# Patient Record
Sex: Male | Born: 1965 | Race: Black or African American | Hispanic: No | Marital: Married | Smoking: Never smoker
Health system: Southern US, Community
[De-identification: ages and names within clinical notes are randomized; demographics above are authoritative.]

## PROBLEM LIST (undated history)

## (undated) ENCOUNTER — Ambulatory Visit: Disposition: A | Discharge status: Home / Self Care | Payer: Federal, State, Local not specified - PPO

## (undated) DIAGNOSIS — I10 Essential (primary) hypertension: Secondary | ICD-10-CM

## (undated) DIAGNOSIS — K219 Gastro-esophageal reflux disease without esophagitis: Secondary | ICD-10-CM

## (undated) DIAGNOSIS — K429 Umbilical hernia without obstruction or gangrene: Secondary | ICD-10-CM

## (undated) DIAGNOSIS — K4020 Bilateral inguinal hernia, without obstruction or gangrene, not specified as recurrent: Secondary | ICD-10-CM

## (undated) HISTORY — DX: Bilateral inguinal hernia, without obstruction or gangrene, not specified as recurrent: K40.20

## (undated) HISTORY — DX: Essential (primary) hypertension: I10

## (undated) HISTORY — DX: Umbilical hernia without obstruction or gangrene: K42.9

## (undated) HISTORY — DX: Gastro-esophageal reflux disease without esophagitis: K21.9

## (undated) HISTORY — PX: INGUINAL HERNIA REPAIR: SUR1180

---

## 2005-03-01 ENCOUNTER — Emergency Department (HOSPITAL_COMMUNITY): Admission: EM | Admit: 2005-03-01 | Discharge: 2005-03-02 | Payer: Self-pay | Admitting: Emergency Medicine

## 2009-12-06 ENCOUNTER — Ambulatory Visit: Payer: Self-pay | Admitting: Internal Medicine

## 2009-12-06 DIAGNOSIS — K219 Gastro-esophageal reflux disease without esophagitis: Secondary | ICD-10-CM | POA: Insufficient documentation

## 2009-12-06 DIAGNOSIS — I10 Essential (primary) hypertension: Secondary | ICD-10-CM | POA: Insufficient documentation

## 2010-02-05 ENCOUNTER — Ambulatory Visit: Payer: Self-pay | Admitting: Internal Medicine

## 2010-02-09 ENCOUNTER — Encounter: Payer: Self-pay | Admitting: Internal Medicine

## 2010-02-09 ENCOUNTER — Ambulatory Visit: Payer: Self-pay | Admitting: Internal Medicine

## 2010-02-09 DIAGNOSIS — M545 Low back pain, unspecified: Secondary | ICD-10-CM | POA: Insufficient documentation

## 2010-02-09 DIAGNOSIS — M79605 Pain in left leg: Secondary | ICD-10-CM | POA: Insufficient documentation

## 2010-02-09 DIAGNOSIS — M5412 Radiculopathy, cervical region: Secondary | ICD-10-CM | POA: Insufficient documentation

## 2010-02-26 ENCOUNTER — Ambulatory Visit: Payer: Self-pay | Admitting: Internal Medicine

## 2010-02-26 DIAGNOSIS — R209 Unspecified disturbances of skin sensation: Secondary | ICD-10-CM | POA: Insufficient documentation

## 2010-03-09 ENCOUNTER — Encounter (INDEPENDENT_AMBULATORY_CARE_PROVIDER_SITE_OTHER): Payer: Self-pay | Admitting: *Deleted

## 2010-03-15 ENCOUNTER — Encounter: Payer: Self-pay | Admitting: Internal Medicine

## 2010-07-02 ENCOUNTER — Ambulatory Visit: Payer: Self-pay | Admitting: Internal Medicine

## 2010-07-02 DIAGNOSIS — G4730 Sleep apnea, unspecified: Secondary | ICD-10-CM | POA: Insufficient documentation

## 2010-07-11 ENCOUNTER — Ambulatory Visit: Payer: Self-pay | Admitting: Pulmonary Disease

## 2010-07-17 ENCOUNTER — Encounter
Admission: RE | Admit: 2010-07-17 | Discharge: 2010-08-13 | Payer: Self-pay | Source: Home / Self Care | Attending: Internal Medicine | Admitting: Internal Medicine

## 2010-07-18 ENCOUNTER — Encounter: Payer: Self-pay | Admitting: Internal Medicine

## 2010-08-13 ENCOUNTER — Encounter: Payer: Self-pay | Admitting: Internal Medicine

## 2010-08-16 NOTE — Letter (Signed)
Summary: Results Follow-up Letter  [facility]  [address] [place], [place] [address]   Phone: [phone]  Fax: [phone]    02/09/2010  [address] [place], [place]  [address]  Dear Andre Stein,   The following are the results of your recent test(s):  Test     Result     Low back xrays   normal Neck Xrays     severe arthritis   _________________________________________________________  Please call for an appointment soon, I think you need to have an MRI of your neck done _________________________________________________________ _________________________________________________________ _________________________________________________________  Sincerely,  Sanda Linger MD  [facility]

## 2010-08-16 NOTE — Assessment & Plan Note (Signed)
Summary: f/u appt/#/cd   Vital Signs:  Patient profile:   45 year old male Height:      71 inches Weight:      223 pounds BMI:     31.21 O2 Sat:      98 % on Room air Temp:     97.8 degrees F oral Pulse rate:   64 / minute Pulse rhythm:   regular Resp:     16 per minute BP sitting:   112 / 76  (left arm) Cuff size:   large  Vitals Entered By: Rock Nephew CMA (July 02, 2010 11:37 AM)  Nutrition Counseling: Patient's BMI is greater than 25 and therefore counseled on weight management options.  O2 Flow:  Room air CC: follow-up visit//med refill Is Patient Diabetic? No Pain Assessment Patient in pain? no       Does patient need assistance? Functional Status Self care Ambulation Normal   Primary Care Provider:  Etta Grandchild MD  CC:  follow-up visit//med refill.  History of Present Illness: He returns for f/up and to discuss his recent neuro. testing. He has an acute RUE radiculopathy on the EMG but he is happy to report today that all of his symptoms have resolved (no N/W/T and no neck or back pain). He wants to try PT.  Also, his wife tells me that his snoring is "really bad" and she has witnessed apnea.  Dyspepsia History:      He has no alarm features of dyspepsia including no history of melena, hematochezia, dysphagia, persistent vomiting, or involuntary weight loss > 5%.  There is a prior history of GERD.  The patient does not have a prior history of documented ulcer disease.  The dominant symptom is heartburn or acid reflux.  An H-2 blocker medication is currently being taken.  He notes that the symptoms have improved with the H-2 blocker therapy.  Symptoms have not persisted after 4 weeks of H-2 blocker treatment.     Preventive Screening-Counseling & Management  Alcohol-Tobacco     Alcohol drinks/day: 0     Alcohol Counseling: not indicated; patient does not drink     Smoking Status: never     Tobacco Counseling: not indicated; no tobacco  use  Current Medications (verified): 1)  Nexium 40 Mg Cpdr (Esomeprazole Magnesium) .... Once Daily 2)  Metoprolol Succinate 25 Mg Xr24h-Tab (Metoprolol Succinate) .... Once Daily 3)  Naproxen 500 Mg Tabs (Naproxen) .... One By Mouth Two Times A Day For Pain  Allergies (verified): No Known Drug Allergies  Past History:  Past Medical History: Last updated: 12/06/2009 GERD Hypertension  Past Surgical History: Last updated: 12/06/2009 Denies surgical history  Family History: Last updated: 12/06/2009 Family History of Arthritis Family History Diabetes 1st degree relative Family History Hypertension Family History of Stroke M 1st degree relative <50  Social History: Last updated: 02/09/2010 Occupation: Theatre manager Married Never Smoked Alcohol use-no Drug use-no Regular exercise-yes  Risk Factors: Alcohol Use: 0 (07/02/2010) Exercise: yes (12/06/2009)  Risk Factors: Smoking Status: never (07/02/2010)  Family History: Reviewed history from 12/06/2009 and no changes required. Family History of Arthritis Family History Diabetes 1st degree relative Family History Hypertension Family History of Stroke M 1st degree relative <50  Social History: Reviewed history from 02/09/2010 and no changes required. Occupation: Theatre manager Married Never Smoked Alcohol use-no Drug use-no Regular exercise-yes  Review of Systems       The patient complains of weight gain.  The patient denies anorexia,  fever, weight loss, chest pain, syncope, dyspnea on exertion, peripheral edema, prolonged cough, headaches, hemoptysis, abdominal pain, melena, hematochezia, severe indigestion/heartburn, hematuria, muscle weakness, suspicious skin lesions, difficulty walking, abnormal bleeding, and enlarged lymph nodes.   Resp:  Complains of excessive snoring, hypersomnolence, and morning headaches; denies chest discomfort, chest pain with inspiration, cough,  coughing up blood, pleuritic, shortness of breath, sputum productive, and wheezing.  Physical Exam  General:  alert, well-developed, well-nourished, well-hydrated, appropriate dress, normal appearance, healthy-appearing, cooperative to examination, and overweight-appearing.   Head:  normocephalic, atraumatic, no abnormalities observed, and no abnormalities palpated.   Mouth:  Oral mucosa and oropharynx without lesions or exudates.  Teeth in good repair. Neck:  supple, full ROM, no masses, no thyromegaly, no JVD, normal carotid upstroke, no carotid bruits, no cervical lymphadenopathy, and no neck tenderness.   Lungs:  normal respiratory effort, no intercostal retractions, no accessory muscle use, normal breath sounds, no dullness, no fremitus, no crackles, and no wheezes.   Heart:  normal rate, regular rhythm, no murmur, no gallop, no rub, and no JVD.   Abdomen:  soft, non-tender, normal bowel sounds, no distention, no masses, no guarding, no rigidity, no rebound tenderness, no abdominal hernia, no inguinal hernia, no hepatomegaly, and no splenomegaly.   Msk:  normal ROM, no joint tenderness, no joint swelling, no joint warmth, no redness over joints, no joint deformities, no joint instability, no crepitation, and no muscle atrophy.   Pulses:  R and L carotid,radial,femoral,dorsalis pedis and posterior tibial pulses are full and equal bilaterally Extremities:  No clubbing, cyanosis, edema, or deformity noted with normal full range of motion of all joints.   Neurologic:  No cranial nerve deficits noted. Station and gait are normal. Plantar reflexes are down-going bilaterally. DTRs are symmetrical throughout. Sensory, motor and coordinative functions appear intact. Skin:  turgor normal, color normal, no rashes, no suspicious lesions, no ecchymoses, no petechiae, no purpura, no ulcerations, and no edema.   Cervical Nodes:  No lymphadenopathy noted Psych:  Cognition and judgment appear intact. Alert and  cooperative with normal attention span and concentration. No apparent delusions, illusions, hallucinations   Impression & Recommendations:  Problem # 1:  BACK PAIN, LUMBAR (ICD-724.2) Assessment Improved  His updated medication list for this problem includes:    Naproxen 500 Mg Tabs (Naproxen) ..... One by mouth two times a day for pain  Orders: Physical Therapy Referral (PT)  Problem # 2:  CERVICAL RADICULOPATHY (ICD-723.4) Assessment: Improved  Orders: Physical Therapy Referral (PT)  Problem # 3:  SLEEP APNEA (ICD-780.57) Assessment: New  Orders: Sleep Disorder Referral (Sleep Disorder)  Problem # 4:  HYPERTENSION (ICD-401.9) Assessment: Improved  His updated medication list for this problem includes:    Metoprolol Succinate 25 Mg Xr24h-tab (Metoprolol succinate) ..... Once daily  BP today: 112/76 Prior BP: 112/80 (02/26/2010)  Prior 10 Yr Risk Heart Disease: Not enough information (12/06/2009)  Problem # 5:  GERD (ICD-530.81) Assessment: Improved  His updated medication list for this problem includes:    Nexium 40 Mg Cpdr (Esomeprazole magnesium) ..... Once daily  Complete Medication List: 1)  Nexium 40 Mg Cpdr (Esomeprazole magnesium) .... Once daily 2)  Metoprolol Succinate 25 Mg Xr24h-tab (Metoprolol succinate) .... Once daily 3)  Naproxen 500 Mg Tabs (Naproxen) .... One by mouth two times a day for pain  Patient Instructions: 1)  Please schedule a follow-up appointment in 2 months. 2)  Avoid foods high in acid (tomatoes, citrus juices, spicy foods). Avoid eating within two hours  of lying down or before exercising. Do not over eat; try smaller more frequent meals. Elevate head of bed twelve inches when sleeping. 3)  It is important that you exercise regularly at least 20 minutes 5 times a week. If you develop chest pain, have severe difficulty breathing, or feel very tired , stop exercising immediately and seek medical attention. 4)  You need to lose weight.  Consider a lower calorie diet and regular exercise.  5)  Check your Blood Pressure regularly. If it is above 140/90: you should make an appointment. Prescriptions: METOPROLOL SUCCINATE 25 MG XR24H-TAB (METOPROLOL SUCCINATE) once daily  #30 x 11   Entered and Authorized by:   Etta Grandchild MD   Signed by:   Etta Grandchild MD on 07/02/2010   Method used:   Electronically to        [facility] (retail)       [address]       [place], [place]  [id number]       Ph: [phone]       Fax: [phone]   RxID:   [id number] NEXIUM 40 MG CPDR (ESOMEPRAZOLE MAGNESIUM) once daily  #30 x 11   Entered and Authorized by:   Etta Grandchild MD   Signed by:   Etta Grandchild MD on 07/02/2010   Method used:   Electronically to        [facility] (retail)       [address]       [place], [place]  [id number]       Ph: [phone]       Fax: [phone]   RxID:   [id number]    Orders Added: 1)  Sleep Disorder Referral [Sleep Disorder] 2)  Physical Therapy Referral [PT] 3)  Est. Patient Level V [[address]]    Preventive Care Screening  Last Tetanus Booster:    Date:  07/15/2004    Results:  Historical

## 2010-08-16 NOTE — Assessment & Plan Note (Signed)
Summary: PER SD/SCHED--BACK AND FRONT SHOULDER PAIN-ARM AND HAND NUMBN...   Vital Signs:  Patient profile:   45 year old male Height:      71 inches Weight:      221 pounds BMI:     30.93 O2 Sat:      96 % on Room air Temp:     98.0 degrees F oral Pulse rate:   70 / minute Pulse rhythm:   irregular Resp:     16 per minute BP sitting:   120 / 80  (left arm) Cuff size:   large  Vitals Entered By: Rock Nephew CMA (February 09, 2010 2:21 PM)  O2 Flow:  Room air CC: Patient c/o R side arm/ hand pain w/ numbness. Also c/o back pain x 43mo, Back pain Is Patient Diabetic? No Pain Assessment Patient in pain? yes     Location: back& RUE Type: sharp Onset of pain  Constant   Primary Care Provider:  Etta Grandchild MD  CC:  Patient c/o R side arm/ hand pain w/ numbness. Also c/o back pain x 43mo and Back pain.  History of Present Illness:  Back Pain      This is a 45 year old man who presents with Back pain.  The symptoms began 4-8 weeks ago.  The intensity is described as moderate.  The patient denies fever, chills, weakness, loss of sensation, fecal incontinence, urinary incontinence, urinary retention, dysuria, rest pain, inability to work, and inability to care for self.  The pain is located in the mid low back.  The pain began gradually.  The pain is made worse by standing or walking.  The pain is made better by NSAID medications.  Risk factors for serious underlying conditions include duration of pain > 1 month.    He also has pain in his right arm up to the shoulder and around the right collar bone with numbness and tingling in his right forearm. He has had this for several weeks and it is not worsening. He does not admit to any neck pain.  Hypertension Follow-Up      The patient also presents for Hypertension follow-up.  The patient denies lightheadedness, urinary frequency, headaches, edema, impotence, rash, and fatigue.  The patient denies the following associated symptoms:  chest pain, chest pressure, exercise intolerance, dyspnea, palpitations, syncope, leg edema, and pedal edema.  Compliance with medications (by patient report) has been near 100%.  The patient reports that dietary compliance has been good.  The patient reports no exercise.  Adjunctive measures currently used by the patient include salt restriction and relaxation.    Preventive Screening-Counseling & Management  Alcohol-Tobacco     Alcohol drinks/day: 0     Smoking Status: never  Hep-HIV-STD-Contraception     Hepatitis Risk: no risk noted     HIV Risk: no risk noted     STD Risk: no risk noted  Medications Prior to Update: 1)  Nexium 40 Mg Cpdr (Esomeprazole Magnesium) .... Once Daily 2)  Metoprolol Succinate 25 Mg Xr24h-Tab (Metoprolol Succinate) .... Once Daily  Current Medications (verified): 1)  Nexium 40 Mg Cpdr (Esomeprazole Magnesium) .... Once Daily 2)  Metoprolol Succinate 25 Mg Xr24h-Tab (Metoprolol Succinate) .... Once Daily 3)  Naproxen 500 Mg Tabs (Naproxen) .... 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   MS:  Complains of low back pain; denies joint pain, joint redness, joint swelling, loss of strength, muscle aches, muscle, cramps, muscle weakness, stiffness, and thoracic pain. Neuro:  Complains of numbness and tingling; denies brief paralysis, difficulty with concentration, disturbances in coordination, headaches, inability to speak, memory loss, poor balance, seizures, sensation of room spinning, tremors, visual disturbances, and weakness.  Physical Exam  General:  alert, well-developed, well-nourished, well-hydrated, appropriate dress, normal appearance, healthy-appearing, cooperative to examination, and overweight-appearing.   Head:  normocephalic, atraumatic, no abnormalities observed, and no abnormalities palpated.   Eyes:  vision grossly intact, pupils equal, pupils round, pupils reactive to light, and no nystagmus.   Ears:  R ear normal and L ear normal.   Mouth:  Oral mucosa and oropharynx without lesions or exudates.  Teeth in good repair. Neck:  supple, full ROM, no masses, no thyromegaly, no JVD, normal carotid upstroke, no carotid bruits, no cervical lymphadenopathy, and no neck tenderness.   Lungs:  normal respiratory effort, no intercostal retractions, no accessory muscle use, normal breath sounds, no dullness, no fremitus, no crackles, and no wheezes.   Heart:  normal rate, regular rhythm, no murmur, no gallop, no rub, and no JVD.   Abdomen:  soft, non-tender, normal bowel sounds, no distention, no masses, no guarding, no rigidity, no rebound tenderness, no abdominal hernia, no inguinal hernia, no hepatomegaly, and no splenomegaly.   Msk:  normal ROM, no joint tenderness, no joint swelling, no joint warmth, no redness over joints, no joint deformities, no joint instability, no crepitation, and no muscle atrophy.   Pulses:  R and L  carotid,radial,femoral,dorsalis pedis and posterior tibial pulses are full and equal bilaterally Extremities:  No clubbing, cyanosis, edema, or deformity noted with normal full range of motion of all joints.   Neurologic:  No cranial nerve deficits noted. Station and gait are normal. Plantar reflexes are down-going bilaterally. DTRs are symmetrical throughout. Sensory, motor and coordinative functions appear intact. Skin:  turgor normal, color normal, no rashes, no suspicious lesions, no ecchymoses, no petechiae, no purpura, no ulcerations, and no edema.   Cervical Nodes:  No lymphadenopathy noted Axillary Nodes:  No palpable lymphadenopathy Psych:  Cognition and judgment appear intact. Alert and cooperative with normal attention span and concentration. No apparent delusions, illusions, hallucinations   Impression & Recommendations:  Problem # 1:  BACK PAIN, LUMBAR (ICD-724.2) willi look for DDD, boney lesion, etc. His updated medication list for this problem includes:    Naproxen 500 Mg Tabs (Naproxen) ..... One by mouth two times a day for pain  Orders: T-Lumbar Spine Complete, 5 Views [phone])  Discussed use of moist heat or ice, modified activities, medications, and stretching/strengthening exercises. Back care instructions given. To be seen in 2 weeks if no improvement; sooner if worsening of symptoms.   Problem # 2:  CERVICAL RADICULOPATHY (ICD-723.4) willi look for  lesion in the C-spine to explain his symptoms, may consider doing a NCS/EMG re the paresthesias (? CTS). Orders: T-Cervical Spine Comp 4 Views (72050TC)  Problem # 3:  HYPERTENSION (ICD-401.9) Assessment: Improved  His updated medication list for this problem includes:    Metoprolol Succinate 25 Mg Xr24h-tab (Metoprolol succinate) ..... Once daily  BP today: 120/80 Prior BP: 120/88 (12/06/2009)  Prior 10 Yr Risk Heart Disease: Not enough information (12/06/2009)  Complete Medication List: 1)  Nexium 40 Mg Cpdr  (Esomeprazole magnesium) .... Once daily 2)  Metoprolol Succinate 25 Mg Xr24h-tab (Metoprolol succinate) .... Once daily 3)  Naproxen 500 Mg Tabs (Naproxen) .... One by mouth two times a day for pain  Patient Instructions: 1)  Please schedule a follow-up appointment in 2 weeks. 2)  It is important that you exercise regularly at least 20 minutes 5 times a week. If you develop chest pain, have severe difficulty breathing, or feel very tired , stop exercising immediately and seek medical attention. 3)  You need to lose weight. Consider a lower calorie diet and regular exercise.  4)  Take 650-1000mg  of Tylenol every 4-6 hours as needed for relief of pain or comfort of fever AVOID taking more than 4000mg   in a 24 hour period (can cause liver damage in higher doses). 5)  Most patients (90%) with low back pain will improve with time (2-6 weeks). Keep active but avoid activities that are painful. Apply moist heat and/or ice to lower back several times a day. Prescriptions: NAPROXEN 500 MG TABS (NAPROXEN) One by mouth two times a day for pain  #60 x 3   Entered and Authorized by:   Etta Grandchild MD   Signed by:   Etta Grandchild MD on 02/09/2010   Method used:   Print then Give to Patient   RxID:   [id number]    Not Administered:    Tetanus Vaccine not given due to: declined

## 2010-08-16 NOTE — Letter (Signed)
Summary: [facility] Consult Scheduled Letter  [facility]  [address] [place], [place] [address]   Phone: [phone]  Fax: [phone]      03/09/2010 MRN: [id number]  Andre Stein [address] [place], [place]  [address]    Dear [name],      We have scheduled an appointment for you.  At the recommendation of Dr.Jones, we have scheduled you a consult with Dr Anne Hahn on 03/15/10 at 10:00am.  Their phone number is [phone].If this appointment day and time is not convenient for you, please feel free to call the office of the doctor you are being referred to at the number listed above and reschedule the appointment.     [facility] [address] [place], [place] [address]    *Please arrive 30 minutes prior to appointment time.*     Thank you,  Patient Care Coordinator [facility]

## 2010-08-16 NOTE — Miscellaneous (Signed)
Summary: PT Summary/[facility]  PT Summary/[facility]   Imported By: Sherian Rein 07/20/2010 14:32:45  _____________________________________________________________________  External Attachment:    Type:   Image     Comment:   External Document

## 2010-08-16 NOTE — Letter (Signed)
Summary: [facility]  [facility]   Imported By: Sherian Rein 12/08/2009 15:13:07  _____________________________________________________________________  External Attachment:    Type:   Image     Comment:   External Document

## 2010-08-16 NOTE — Assessment & Plan Note (Signed)
Summary: 2 WEEK FOLLOW UP/JSS   Vital Signs:  Patient profile:   45 year old male Height:      71 inches Weight:      219.50 pounds O2 Sat:      98 % on Room air Temp:     98.4 degrees F oral Pulse rate:   65 / minute Pulse rhythm:   regular Resp:     16 per minute BP sitting:   112 / 80  Vitals Entered By: Rock Nephew CMA (February 26, 2010 9:47 AM)  O2 Flow:  Room air  Primary Care [place]:  Etta Grandchild MD   History of Present Illness: He returns for f/up and he informs me that his LBP has resloved but he is concerned about persistent numbness and tingling in his right hand and he is concerned that he may have CTS. He has not had any neck pain.  Dyspepsia History:      He has no alarm features of dyspepsia including no history of melena, hematochezia, dysphagia, persistent vomiting, or involuntary weight loss > 5%.  There is a prior history of GERD.  The patient does not have a prior history of documented ulcer disease.  The dominant symptom is heartburn or acid reflux.  An H-2 blocker medication is currently being taken.  He notes that the symptoms have improved with the H-2 blocker therapy.  Symptoms have not persisted after 4 weeks of H-2 blocker treatment.    Hypertension History:      He denies headache, chest pain, palpitations, dyspnea with exertion, orthopnea, PND, peripheral edema, visual symptoms, neurologic problems, syncope, and side effects from treatment.  He notes no problems with any antihypertensive medication side effects.        Positive major cardiovascular risk factors include male age 82 years old or older and hypertension.  Negative major cardiovascular risk factors include no history of diabetes or hyperlipidemia, negative family history for ischemic heart disease, and non-tobacco-user status.        Further assessment for target organ damage reveals no history of ASHD, cardiac end-organ damage (CHF/LVH), stroke/TIA, peripheral vascular disease, renal  insufficiency, or hypertensive retinopathy.      Preventive Screening-Counseling & Management  Alcohol-Tobacco     Alcohol drinks/day: 0     Smoking Status: never  Hep-HIV-STD-Contraception     Hepatitis Risk: no risk noted     HIV Risk: no risk noted     STD Risk: no risk noted  Medications Prior to Update: 1)  Nexium 40 Mg Cpdr (Esomeprazole Magnesium) .... Once Daily 2)  Metoprolol Succinate 25 Mg Xr24h-Tab (Metoprolol Succinate) .... Once Daily 3)  Naproxen 500 Mg Tabs (Naproxen) .... One By Mouth Two Times A Day For Pain  Current Medications (verified): 1)  Nexium 40 Mg Cpdr (Esomeprazole Magnesium) .... Once Daily 2)  Metoprolol Succinate 25 Mg Xr24h-Tab (Metoprolol Succinate) .... Once Daily 3)  Naproxen 500 Mg Tabs (Naproxen) .... One By Mouth Two Times A Day For Pain  Allergies (verified): No Known Drug Allergies  Past History:  Past Medical History: Last updated: 12/06/2009 GERD Hypertension  Past Surgical History: Last updated: 12/06/2009 Denies surgical history  Family History: Last updated: 12/06/2009 Family History of Arthritis Family History Diabetes 1st degree relative Family History Hypertension Family History of Stroke M 1st degree relative <50  Social History: Last updated: 02/09/2010 Occupation: Theatre manager Married Never Smoked Alcohol use-no Drug use-no Regular exercise-yes  Risk Factors: Alcohol Use: 0 (02/26/2010) Exercise: yes (  12/06/2009)  Risk Factors: Smoking Status: never (02/26/2010)  Family History: Reviewed history from 12/06/2009 and no changes required. Family History of Arthritis Family History Diabetes 1st degree relative Family History Hypertension Family History of Stroke M 1st degree relative <50  Social History: Reviewed history from 02/09/2010 and no changes required. Occupation: Theatre manager Married Never Smoked Alcohol use-no Drug use-no Regular  exercise-yes  Review of Systems  The patient denies anorexia, fever, weight loss, weight gain, chest pain, syncope, dyspnea on exertion, peripheral edema, prolonged cough, abdominal pain, suspicious skin lesions, difficulty walking, depression, abnormal bleeding, and enlarged lymph nodes.   MS:  Denies joint pain, joint redness, joint swelling, loss of strength, low back pain, mid back pain, muscle aches, muscle, cramps, muscle weakness, stiffness, and thoracic pain. Neuro:  Complains of numbness and tingling; denies brief paralysis, difficulty with concentration, disturbances in coordination, falling down, headaches, inability to speak, memory loss, poor balance, seizures, sensation of room spinning, tremors, visual disturbances, and weakness.  Physical Exam  General:  alert, well-developed, well-nourished, well-hydrated, appropriate dress, normal appearance, healthy-appearing, cooperative to examination, and overweight-appearing.   Head:  normocephalic, atraumatic, no abnormalities observed, and no abnormalities palpated.   Mouth:  Oral mucosa and oropharynx without lesions or exudates.  Teeth in good repair. Neck:  supple, full ROM, no masses, no thyromegaly, no JVD, normal carotid upstroke, no carotid bruits, no cervical lymphadenopathy, and no neck tenderness.   Lungs:  normal respiratory effort, no intercostal retractions, no accessory muscle use, normal breath sounds, no dullness, no fremitus, no crackles, and no wheezes.   Heart:  normal rate, regular rhythm, no murmur, no gallop, no rub, and no JVD.   Abdomen:  soft, non-tender, normal bowel sounds, no distention, no masses, no guarding, no rigidity, no rebound tenderness, no abdominal hernia, no inguinal hernia, no hepatomegaly, and no splenomegaly.   Msk:  normal ROM, no joint tenderness, no joint swelling, no joint warmth, no redness over joints, no joint deformities, no joint instability, no crepitation, and no muscle atrophy.    Pulses:  R and L carotid,radial,femoral,dorsalis pedis and posterior tibial pulses are full and equal bilaterally Extremities:  No clubbing, cyanosis, edema, or deformity noted with normal full range of motion of all joints.   Neurologic:  No cranial nerve deficits noted. Station and gait are normal. Plantar reflexes are down-going bilaterally. DTRs are symmetrical throughout. Sensory, motor and coordinative functions appear intact. Skin:  turgor normal, color normal, no rashes, no suspicious lesions, no ecchymoses, no petechiae, no purpura, no ulcerations, and no edema.   Cervical Nodes:  No lymphadenopathy noted Axillary Nodes:  No palpable lymphadenopathy Psych:  Cognition and judgment appear intact. Alert and cooperative with normal attention span and concentration. No apparent delusions, illusions, hallucinations   Impression & Recommendations:  Problem # 1:  PARESTHESIA (ICD-782.0) Assessment New will check NCS and EMG for CTS Orders: Neurology Referral (Neuro)  Problem # 2:  BACK PAIN, LUMBAR (ICD-724.2) Assessment: Improved  His updated medication list for this problem includes:    Naproxen 500 Mg Tabs (Naproxen) ..... One by mouth two times a day for pain  Discussed use of moist heat or ice, modified activities, medications, and stretching/strengthening exercises. Back care instructions given. To be seen in 2 weeks if no improvement; sooner if worsening of symptoms.   Problem # 3:  CERVICAL RADICULOPATHY (ICD-723.4) Assessment: Improved  Orders: Neurology Referral (Neuro)  Problem # 4:  HYPERTENSION (ICD-401.9) Assessment: Improved  His updated medication list for this  problem includes:    Metoprolol Succinate 25 Mg Xr24h-tab (Metoprolol succinate) ..... Once daily  Problem # 5:  GERD (ICD-530.81) Assessment: Improved  His updated medication list for this problem includes:    Nexium 40 Mg Cpdr (Esomeprazole magnesium) ..... Once daily  Complete Medication  List: 1)  Nexium 40 Mg Cpdr (Esomeprazole magnesium) .... Once daily 2)  Metoprolol Succinate 25 Mg Xr24h-tab (Metoprolol succinate) .... Once daily 3)  Naproxen 500 Mg Tabs (Naproxen) .... One by mouth two times a day for pain  Hypertension Assessment/Plan:      The patient's hypertensive risk group is category B: At least one risk factor (excluding diabetes) with no target organ damage.  Today's blood pressure is 112/80.  His blood pressure goal is < 140/90.  Patient Instructions: 1)  Please schedule a follow-up appointment in 2 months. 2)  Avoid foods high in acid (tomatoes, citrus juices, spicy foods). Avoid eating within two hours of lying down or before exercising. Do not over eat; try smaller more frequent meals. Elevate head of bed twelve inches when sleeping. 3)  It is important that you exercise regularly at least 20 minutes 5 times a week. If you develop chest pain, have severe difficulty breathing, or feel very tired , stop exercising immediately and seek medical attention. 4)  You need to lose weight. Consider a lower calorie diet and regular exercise.  5)  Check your Blood Pressure regularly. If it is above 140/90: you should make an appointment.   Not Administered:    Tetanus Vaccine not given due to: declined

## 2010-08-16 NOTE — Assessment & Plan Note (Signed)
Summary: NEW PT CPX/ [employer]/ WILL COME FASTING/NWS   #   Vital Signs:  Patient profile:   45 year old male Height:      71 inches Weight:      220.25 pounds BMI:     30.83 O2 Sat:      97 % on Room air Temp:     97.8 degrees F oral Pulse rate:   66 / minute Pulse rhythm:   regular Resp:     16 per minute BP sitting:   120 / 88  (left arm) Cuff size:   large  Vitals Entered By: Rock Nephew CMA (Dec 06, 2009 11:08 AM)  Nutrition Counseling: Patient's BMI is greater than 25 and therefore counseled on weight management options.  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 He has been under the care of Dr. Patty Sermons (Card.) for the last year for [facility]. but he says he has no cardiac problems. He he was having dizzy spells prior to being treated for hypertension.  Dyspepsia History:      He has no alarm features of dyspepsia including no history of melena, hematochezia, dysphagia, persistent vomiting, or involuntary weight loss > 5%.  There is a prior history of GERD.  The patient does not have a prior history of documented ulcer disease.  The dominant symptom is heartburn or acid reflux.  An H-2 blocker medication is currently being taken.  He notes that the symptoms have improved with the H-2 blocker therapy.  Symptoms have not persisted after 4 weeks of H-2 blocker treatment.    Hypertension History:      He complains of neurologic problems, but denies headache, chest pain, palpitations, dyspnea with exertion, orthopnea, PND, peripheral edema, visual symptoms, syncope, and side effects from treatment.  He notes no problems with any antihypertensive medication side effects.        Positive major cardiovascular risk factors include male age 47 years old or older and  hypertension.  Negative major cardiovascular risk factors include no history of diabetes or hyperlipidemia, negative family history for ischemic heart disease, and non-tobacco-user status.        Further assessment for target organ damage reveals no history of ASHD, cardiac end-organ damage (CHF/LVH), stroke/TIA, peripheral vascular disease, renal insufficiency, or hypertensive retinopathy.      Preventive Screening-Counseling & Management  Alcohol-Tobacco     Alcohol drinks/day: 0     Smoking Status: never  Caffeine-Diet-Exercise     Does Patient Exercise: yes  Hep-HIV-STD-Contraception     Hepatitis Risk: no risk noted     HIV Risk: no risk noted     STD Risk: no risk noted      Sexual History:  currently monogamous.        Drug Use:  no.        Blood Transfusions:  no.    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    Physical Exam  General:  alert, well-developed, well-nourished, well-hydrated, appropriate dress, normal appearance, healthy-appearing, cooperative to  examination, good hygiene, and overweight-appearing.   Head:  normocephalic, atraumatic, no abnormalities observed, and no abnormalities palpated.   Eyes:  vision grossly intact, pupils equal, pupils round, and pupils reactive to light.   Mouth:  Oral mucosa and oropharynx without lesions or exudates.  Teeth in good repair. Neck:  supple, full ROM, no masses, no thyromegaly, no JVD, normal carotid upstroke, and no carotid bruits.   Lungs:  normal respiratory effort, no intercostal retractions, no accessory muscle use, normal breath sounds, no dullness, no fremitus, no crackles, and no wheezes.   Heart:  normal rate, regular rhythm, no murmur, no gallop, no rub, and no JVD.   Abdomen:  soft, non-tender, normal bowel sounds, no distention, no masses, no guarding, no rigidity, no rebound tenderness, no abdominal hernia, no inguinal hernia, no hepatomegaly, and no splenomegaly.   Msk:  normal ROM, no joint tenderness, no joint swelling, no joint warmth, no redness over joints, no joint deformities, no joint instability, no crepitation, and no muscle atrophy.   Pulses:  R and L carotid,radial,femoral,dorsalis pedis and posterior tibial pulses are full and equal bilaterally Extremities:  No clubbing, cyanosis, edema, or deformity noted with normal full range of motion of all joints.   Neurologic:  No cranial nerve deficits noted. Station and gait are normal. Plantar reflexes are down-going bilaterally. DTRs are symmetrical throughout. Sensory, motor and coordinative functions appear intact. Skin:  turgor normal, color normal, no rashes, no suspicious lesions, no ecchymoses, no petechiae, no purpura, no ulcerations, and no edema.   Cervical Nodes:  no anterior cervical adenopathy and no posterior cervical adenopathy.   Axillary Nodes:  no R axillary adenopathy and no L axillary adenopathy.   Psych:  Cognition and judgment appear intact. Alert and cooperative with normal attention span and concentration. No  apparent delusions, illusions, hallucinations   Impression & Recommendations:  Problem # 1:  HYPERTENSION (ICD-401.9) Assessment Improved  His updated medication list for this problem includes:    Metoprolol Succinate 25 Mg Xr24h-tab (Metoprolol succinate) ..... Once daily  Problem # 2:  GERD (ICD-530.81) Assessment: Improved  His updated medication list for this problem includes:    Nexium 40 Mg Cpdr (Esomeprazole magnesium) ..... Once daily  Complete Medication List: 1)  Nexium 40 Mg Cpdr (Esomeprazole magnesium) .... Once daily 2)  Metoprolol Succinate 25 Mg Xr24h-tab (Metoprolol succinate) .... Once daily  Hypertension Assessment/Plan:      The patient's hypertensive risk group is category B: At least one risk factor (excluding diabetes) with no target organ damage.  Today's blood pressure is 120/88.  His blood pressure goal is < 140/90.  Patient Instructions: 1)  Check your Blood Pressure regularly. If it is above 130/80: you should make an appointment. 2)  It is important that you exercise regularly at least 20 minutes 5 times a week. If you develop chest pain, have severe difficulty breathing, or feel very tired ,  stop exercising immediately and seek medical attention. 3)  You need to lose weight. Consider a lower calorie diet and regular exercise.  4)  Please schedule a follow-up appointment in 2 months. 5)  Avoid foods high in acid (tomatoes, citrus juices, spicy foods). Avoid eating within two hours of lying down or before exercising. Do not over eat; try smaller more frequent meals. Elevate head of bed twelve inches when sleeping.

## 2010-08-21 ENCOUNTER — Telehealth (INDEPENDENT_AMBULATORY_CARE_PROVIDER_SITE_OTHER): Payer: Self-pay | Admitting: *Deleted

## 2010-08-24 ENCOUNTER — Encounter: Payer: Self-pay | Admitting: Internal Medicine

## 2010-08-30 NOTE — Progress Notes (Signed)
Summary: NEXIUM PA  Phone Note Call from Patient   Summary of Call: [phone] - Please call # - Pt needs PA on Nexium. Patient is requesting a call back w/update.  Initial call taken by: Lamar Sprinkles, CMA,  August 21, 2010 2:24 PM  Follow-up for Phone Call        [facility], gave clinical info to rep and approved 08/13/2010- 08/14/2011....Marland KitchenMarland KitchenAlvy Beal Archie CMA  August 22, 2010 10:01 AM

## 2010-08-30 NOTE — Miscellaneous (Signed)
Summary: Patient Discharged / [facility]  Patient Discharged / [facility]   Imported By: Lennie Odor 08/21/2010 11:36:02  _____________________________________________________________________  External Attachment:    Type:   Image     Comment:   External Document

## 2010-09-05 NOTE — Medication Information (Signed)
Summary: Nexium approved/[employer]  Nexium approved/[employer]   Imported By: Sherian Rein 08/28/2010 14:47:04  _____________________________________________________________________  External Attachment:    Type:   Image     Comment:   External Document

## 2011-08-07 ENCOUNTER — Ambulatory Visit: Payer: Self-pay | Admitting: Internal Medicine

## 2011-08-12 ENCOUNTER — Encounter: Payer: Self-pay | Admitting: Internal Medicine

## 2011-08-12 ENCOUNTER — Ambulatory Visit (INDEPENDENT_AMBULATORY_CARE_PROVIDER_SITE_OTHER): Payer: Federal, State, Local not specified - PPO | Admitting: Internal Medicine

## 2011-08-12 ENCOUNTER — Other Ambulatory Visit (INDEPENDENT_AMBULATORY_CARE_PROVIDER_SITE_OTHER): Payer: Federal, State, Local not specified - PPO

## 2011-08-12 VITALS — BP 130/90 | HR 64 | Temp 98.7°F | Resp 20 | Wt 222.5 lb

## 2011-08-12 DIAGNOSIS — Z0000 Encounter for general adult medical examination without abnormal findings: Secondary | ICD-10-CM

## 2011-08-12 DIAGNOSIS — K4021 Bilateral inguinal hernia, without obstruction or gangrene, recurrent: Secondary | ICD-10-CM

## 2011-08-12 DIAGNOSIS — I10 Essential (primary) hypertension: Secondary | ICD-10-CM

## 2011-08-12 LAB — URINALYSIS, ROUTINE W REFLEX MICROSCOPIC
Hgb urine dipstick: NEGATIVE
Leukocytes, UA: NEGATIVE
Specific Gravity, Urine: >=1.03 (ref 1.000–1.030)
Urine Glucose: NEGATIVE
Urobilinogen, UA: 0.2 (ref 0.0–1.0)

## 2011-08-12 LAB — CBC WITH DIFFERENTIAL/PLATELET
Basophils Relative: 0.4 % (ref 0.0–3.0)
Eosinophils Relative: 2.5 % (ref 0.0–5.0)
HCT: 40.1 % (ref 39.0–52.0)
Hemoglobin: 13.8 g/dL (ref 13.0–17.0)
Lymphs Abs: 1.6 10*3/uL (ref 0.7–4.0)
MCV: 88.8 fl (ref 78.0–100.0)
Monocytes Absolute: 0.4 10*3/uL (ref 0.1–1.0)
Monocytes Relative: 7.5 % (ref 3.0–12.0)
Neutro Abs: 2.8 10*3/uL (ref 1.4–7.7)
Platelets: 183 10*3/uL (ref 150.0–400.0)
WBC: 4.9 10*3/uL (ref 4.5–10.5)

## 2011-08-12 LAB — COMPREHENSIVE METABOLIC PANEL
Alkaline Phosphatase: 62 U/L (ref 39–117)
BUN: 11 mg/dL (ref 6–23)
CO2: 30 mEq/L (ref 19–32)
Creatinine, Ser: 1.1 mg/dL (ref 0.4–1.5)
GFR: 90.35 mL/min (ref >60.00)
Glucose, Bld: 94 mg/dL (ref 70–99)
Sodium: 142 mEq/L (ref 135–145)
Total Bilirubin: 0.7 mg/dL (ref 0.3–1.2)

## 2011-08-12 LAB — LIPID PANEL: Triglycerides: 254 mg/dL — ABNORMAL HIGH (ref 0.0–149.0)

## 2011-08-12 LAB — TSH: TSH: 2.09 u[IU]/mL (ref 0.35–5.50)

## 2011-08-12 LAB — PSA: PSA: 1.93 ng/mL (ref 0.10–4.00)

## 2011-08-12 MED ORDER — NAPROXEN 500 MG PO TABS: 500.0000 mg | ORAL_TABLET | Freq: Two times a day (BID) | ORAL | Status: DC

## 2011-08-12 MED ORDER — ESOMEPRAZOLE MAGNESIUM 40 MG PO CPDR: 40.0000 mg | DELAYED_RELEASE_CAPSULE | Freq: Every day | ORAL | Status: DC

## 2011-08-12 MED ORDER — METOPROLOL SUCCINATE ER 25 MG PO TB24: 25.0000 mg | ORAL_TABLET | Freq: Every day | ORAL | Status: DC

## 2011-08-12 NOTE — Assessment & Plan Note (Signed)
General surgery referral 

## 2011-08-12 NOTE — Patient Instructions (Signed)
Health Maintenance, Males A healthy lifestyle and preventative care can promote health and wellness.  Maintain regular health, dental, and eye exams.   Eat a healthy diet. Foods like vegetables, fruits, whole grains, low-fat dairy products, and lean protein foods contain the nutrients you need without too many calories. Decrease your intake of foods high in solid fats, added sugars, and salt. Get information about a proper diet from your caregiver, if necessary.   Regular physical exercise is one of the most important things you can do for your health. Most adults should get at least 150 minutes of moderate-intensity exercise (any activity that increases your heart rate and causes you to sweat) each week. In addition, most adults need muscle-strengthening exercises on 2 or more days a week.    Maintain a healthy weight. The body mass index (BMI) is a screening tool to identify possible weight problems. It provides an estimate of body fat based on height and weight. Your caregiver can help determine your BMI, and can help you achieve or maintain a healthy weight. For adults 20 years and older:   A BMI below 18.5 is considered underweight.   A BMI of 18.5 to 24.9 is normal.   A BMI of 25 to 29.9 is considered overweight.   A BMI of 30 and above is considered obese.   Maintain normal blood lipids and cholesterol by exercising and minimizing your intake of saturated fat. Eat a balanced diet with plenty of fruits and vegetables. Blood tests for lipids and cholesterol should begin at age 20 and be repeated every 5 years. If your lipid or cholesterol levels are high, you are over 50, or you are a high risk for heart disease, you may need your cholesterol levels checked more frequently.Ongoing high lipid and cholesterol levels should be treated with medicines, if diet and exercise are not effective.   If you smoke, find out from your caregiver how to quit. If you do not use tobacco, do not start.    If you choose to drink alcohol, do not exceed 2 drinks per day. One drink is considered to be 12 ounces (355 mL) of beer, 5 ounces (148 mL) of wine, or 1.5 ounces (44 mL) of liquor.   Avoid use of street drugs. Do not share needles with anyone. Ask for help if you need support or instructions about stopping the use of drugs.   High blood pressure causes heart disease and increases the risk of stroke. Blood pressure should be checked at least every 1 to 2 years. Ongoing high blood pressure should be treated with medicines if weight loss and exercise are not effective.   If you are 45 to 48 years old, ask your caregiver if you should take aspirin to prevent heart disease.   Diabetes screening involves taking a blood sample to check your fasting blood sugar level. This should be done once every 3 years, after age 45, if you are within normal weight and without risk factors for diabetes. Testing should be considered at a younger age or be carried out more frequently if you are overweight and have at least 1 risk factor for diabetes.   Colorectal cancer can be detected and often prevented. Most routine colorectal cancer screening begins at the age of 50 and continues through age 75. However, your caregiver may recommend screening at an earlier age if you have risk factors for colon cancer. On a yearly basis, your caregiver may provide home test kits to check for hidden   blood in the stool. Use of a small camera at the end of a tube, to directly examine the colon (sigmoidoscopy or colonoscopy), can detect the earliest forms of colorectal cancer. Talk to your caregiver about this at age 50, when routine screening begins. Direct examination of the colon should be repeated every 5 to 10 years through age 75, unless early forms of pre-cancerous polyps or small growths are found.   Healthy men should no longer receive prostate-specific antigen (PSA) blood tests as part of routine cancer screening. Consult with  your caregiver about prostate cancer screening.   Practice safe sex. Use condoms and avoid high-risk sexual practices to reduce the spread of sexually transmitted infections (STIs).   Use sunscreen with a sun protection factor (SPF) of 30 or greater. Apply sunscreen liberally and repeatedly throughout the day. You should seek shade when your shadow is shorter than you. Protect yourself by wearing long sleeves, pants, a wide-brimmed hat, and sunglasses year round, whenever you are outdoors.   Notify your caregiver of new moles or changes in moles, especially if there is a change in shape or color. Also notify your caregiver if a mole is larger than the size of a pencil eraser.   A one-time screening for abdominal aortic aneurysm (AAA) and surgical repair of large AAAs by sound wave imaging (ultrasonography) is recommended for ages 65 to 75 years who are current or former smokers.   Stay current with your immunizations.  Document Released: 12/28/2007 Document Revised: 03/13/2011 Document Reviewed: 11/26/2010 [employer] Patient Information 2012 [employer].Hypertension As your heart beats, it forces blood through your arteries. This force is your blood pressure. If the pressure is too high, it is called hypertension (HTN) or high blood pressure. HTN is dangerous because you may have it and not know it. High blood pressure may mean that your heart has to work harder to pump blood. Your arteries may be narrow or stiff. The extra work puts you at risk for heart disease, stroke, and other problems.  Blood pressure consists of two numbers, a higher number over a lower, 110/72, for example. It is stated as "110 over 72." The ideal is below 120 for the top number (systolic) and under 80 for the bottom (diastolic). Write down your blood pressure today. You should pay close attention to your blood pressure if you have certain conditions such as:  Heart failure.   Prior heart attack.   Diabetes   Chronic  kidney disease.   Prior stroke.   Multiple risk factors for heart disease.  To see if you have HTN, your blood pressure should be measured while you are seated with your arm held at the level of the heart. It should be measured at least twice. A one-time elevated blood pressure reading (especially in the Emergency Department) does not mean that you need treatment. There may be conditions in which the blood pressure is different between your right and left arms. It is important to see your caregiver soon for a recheck. Most people have essential hypertension which means that there is not a specific cause. This type of high blood pressure may be lowered by changing lifestyle factors such as:  Stress.   Smoking.   Lack of exercise.   Excessive weight.   Drug/tobacco/alcohol use.   Eating less salt.  Most people do not have symptoms from high blood pressure until it has caused damage to the body. Effective treatment can often prevent, delay or reduce that damage. TREATMENT    When a cause has been identified, treatment for high blood pressure is directed at the cause. There are a large number of medications to treat HTN. These fall into several categories, and your caregiver will help you select the medicines that are best for you. Medications may have side effects. You should review side effects with your caregiver. If your blood pressure stays high after you have made lifestyle changes or started on medicines,   Your medication(s) may need to be changed.   Other problems may need to be addressed.   Be certain you understand your prescriptions, and know how and when to take your medicine.   Be sure to follow up with your caregiver within the time frame advised (usually within two weeks) to have your blood pressure rechecked and to review your medications.   If you are taking more than one medicine to lower your blood pressure, make sure you know how and at what times they should be taken.  Taking two medicines at the same time can result in blood pressure that is too low.  SEEK IMMEDIATE MEDICAL CARE IF:  You develop a severe headache, blurred or changing vision, or confusion.   You have unusual weakness or numbness, or a faint feeling.   You have severe chest or abdominal pain, vomiting, or breathing problems.  MAKE SURE YOU:   Understand these instructions.   Will watch your condition.   Will get help right away if you are not doing well or get worse.  Document Released: 07/01/2005 Document Revised: 03/13/2011 Document Reviewed: 02/19/2008 [employer] Patient Information 2012 [employer]. 

## 2011-08-12 NOTE — Assessment & Plan Note (Signed)
Exam done, labs ordered, vaccines updated, pt ed material was given 

## 2011-08-12 NOTE — Assessment & Plan Note (Signed)
BP is adequately controlled. 

## 2011-08-12 NOTE — Progress Notes (Signed)
Subjective:    Patient ID: Andre Stein, male    DOB: 11-Jan-1966, 46 y.o.   MRN: [id number]  Hypertension This is a chronic problem. The current episode started more than 1 year ago. The problem is unchanged. The problem is controlled. Pertinent negatives include no anxiety, blurred vision, chest pain, headaches, malaise/fatigue, neck pain, orthopnea, palpitations, peripheral edema, PND, shortness of breath or sweats. There are no associated agents to hypertension. Past treatments include beta blockers. The current treatment provides moderate improvement. Compliance problems include exercise and diet.   Groin Pain The patient's primary symptoms include pelvic pain (left groin). The patient's pertinent negatives include no penile discharge, penile pain, scrotal swelling or testicular pain. This is a new problem. The current episode started more than 1 month ago. The problem occurs intermittently. The problem has been unchanged. The pain is mild. Pertinent negatives include no abdominal pain, anorexia, chest pain, chills, constipation, coughing, diarrhea, discolored urine, dysuria, fever, flank pain, frequency, headaches, hematuria, hesitancy, joint pain, joint swelling, nausea, painful intercourse, rash, shortness of breath, sore throat, urgency, urinary retention or vomiting. The symptoms are aggravated by tactile pressure. He has tried nothing for the symptoms. His sexual activity is non-contributory to the current illness. His past medical history is significant for an inguinal hernia.      Review of Systems  Constitutional: Negative for fever, chills, malaise/fatigue, diaphoresis, activity change, appetite change, fatigue and unexpected weight change.  HENT: Negative for sore throat, trouble swallowing, neck pain and voice change.   Eyes: Negative.  Negative for blurred vision.  Respiratory: Negative for apnea, cough, choking, chest tightness, shortness of breath, wheezing and stridor.     Cardiovascular: Negative for chest pain, palpitations, orthopnea, leg swelling and PND.  Gastrointestinal: Negative for nausea, vomiting, abdominal pain, diarrhea, constipation, blood in stool, abdominal distention, anal bleeding, rectal pain and anorexia.  Genitourinary: Positive for pelvic pain (left groin). Negative for dysuria, hesitancy, urgency, frequency, hematuria, flank pain, decreased urine volume, discharge, penile swelling, scrotal swelling, enuresis, difficulty urinating, genital sores, penile pain and testicular pain.  Musculoskeletal: Positive for back pain (chronic, unchanged). Negative for myalgias, joint pain, joint swelling, arthralgias and gait problem.  Skin: Negative for color change, pallor, rash and wound.  Neurological: Negative for dizziness, tremors, seizures, syncope, facial asymmetry, speech difficulty, weakness, light-headedness, numbness and headaches.  Hematological: Negative for adenopathy. Does not bruise/bleed easily.  Psychiatric/Behavioral: Negative.        Objective:   Physical Exam  Vitals reviewed. Constitutional: He is oriented to person, place, and time. He appears well-developed and well-nourished. No distress.  HENT:  Head: Normocephalic and atraumatic.  Mouth/Throat: Oropharynx is clear and moist. No oropharyngeal exudate.  Eyes: Conjunctivae are normal. Right eye exhibits no discharge. Left eye exhibits no discharge. No scleral icterus.  Neck: Normal range of motion. Neck supple. No JVD present. No tracheal deviation present. No thyromegaly present.  Cardiovascular: Normal rate, regular rhythm, normal heart sounds and intact distal pulses.  Exam reveals no gallop and no friction rub.   No murmur heard. Pulmonary/Chest: Effort normal and breath sounds normal. No stridor. No respiratory distress. He has no wheezes. He has no rales. He exhibits no tenderness.  Abdominal: Soft. Bowel sounds are normal. He exhibits no distension and no mass. There is  no tenderness. There is no rebound and no guarding. A hernia is present. Hernia confirmed positive in the right inguinal area (indirect, only protrudes with valsalva) and confirmed positive in the left inguinal area (indirect, only  protrudes with valsalva).  Genitourinary: Rectum normal, prostate normal, testes normal and penis normal. Rectal exam shows no external hemorrhoid, no internal hemorrhoid, no fissure, no mass, no tenderness and anal tone normal. Guaiac negative stool. Prostate is not enlarged and not tender. Right testis shows no mass, no swelling and no tenderness. Right testis is descended. Left testis shows no mass, no swelling and no tenderness. Left testis is descended. Uncircumcised. No phimosis, paraphimosis, hypospadias, penile erythema or penile tenderness. No discharge found.  Musculoskeletal: Normal range of motion. He exhibits no edema and no tenderness.  Lymphadenopathy:    He has no cervical adenopathy.       Right: No inguinal adenopathy present.       Left: No inguinal adenopathy present.  Neurological: He is oriented to person, place, and time.  Skin: Skin is warm and dry. No rash noted. He is not diaphoretic. No erythema. No pallor.  Psychiatric: He has a normal mood and affect. His behavior is normal. Judgment and thought content normal.          Assessment & Plan:

## 2011-08-13 ENCOUNTER — Encounter: Payer: Self-pay | Admitting: Internal Medicine

## 2011-08-26 ENCOUNTER — Ambulatory Visit (INDEPENDENT_AMBULATORY_CARE_PROVIDER_SITE_OTHER): Payer: Federal, State, Local not specified - PPO | Admitting: Surgery

## 2011-09-11 ENCOUNTER — Encounter (INDEPENDENT_AMBULATORY_CARE_PROVIDER_SITE_OTHER): Payer: Self-pay | Admitting: Surgery

## 2011-09-16 ENCOUNTER — Encounter: Payer: Self-pay | Admitting: Internal Medicine

## 2011-09-16 ENCOUNTER — Ambulatory Visit (INDEPENDENT_AMBULATORY_CARE_PROVIDER_SITE_OTHER): Payer: Federal, State, Local not specified - PPO | Admitting: Internal Medicine

## 2011-09-16 VITALS — BP 120/78 | HR 70 | Temp 97.8°F | Resp 16 | Wt 224.0 lb

## 2011-09-16 DIAGNOSIS — I10 Essential (primary) hypertension: Secondary | ICD-10-CM

## 2011-09-16 DIAGNOSIS — K4021 Bilateral inguinal hernia, without obstruction or gangrene, recurrent: Secondary | ICD-10-CM

## 2011-09-16 NOTE — Progress Notes (Signed)
  Subjective:    Patient ID: Andre Stein, male    DOB: [birth year], 46 y.o.   MRN: [id number]  Hypertension This is a chronic problem. The current episode started more than 1 year ago. The problem has been gradually improving since onset. The problem is controlled. Pertinent negatives include no anxiety, blurred vision, chest pain, headaches, malaise/fatigue, neck pain, orthopnea, palpitations, peripheral edema, PND, shortness of breath or sweats. Past treatments include beta blockers. The current treatment provides moderate improvement. Compliance problems include exercise and diet.       Review of Systems  Constitutional: Negative for fever, chills, malaise/fatigue, diaphoresis, activity change, appetite change, fatigue and unexpected weight change.  HENT: Negative.  Negative for neck pain.   Eyes: Negative.  Negative for blurred vision.  Respiratory: Negative for cough, chest tightness, shortness of breath, wheezing and stridor.   Cardiovascular: Negative for chest pain, palpitations, orthopnea, leg swelling and PND.  Gastrointestinal: Negative for nausea, vomiting, abdominal pain, diarrhea and blood in stool.  Genitourinary: Negative for dysuria, urgency, frequency, hematuria, flank pain, decreased urine volume, enuresis and difficulty urinating.  Musculoskeletal: Negative for myalgias, back pain, joint swelling, arthralgias and gait problem.  Skin: Negative for color change, pallor, rash and wound.  Neurological: Negative for dizziness, tremors, seizures, syncope, facial asymmetry, speech difficulty, weakness, light-headedness, numbness and headaches.  Hematological: Negative for adenopathy. Does not bruise/bleed easily.  Psychiatric/Behavioral: Negative.        Objective:   Physical Exam  Vitals reviewed. Constitutional: He is oriented to person, place, and time. He appears well-developed and well-nourished. No distress.  HENT:  Head: Normocephalic and atraumatic.    Mouth/Throat: No oropharyngeal exudate.  Eyes: Conjunctivae are normal. Right eye exhibits no discharge. Left eye exhibits no discharge. No scleral icterus.  Neck: Normal range of motion. Neck supple. No JVD present. No tracheal deviation present. No thyromegaly present.  Cardiovascular: Normal rate, regular rhythm, normal heart sounds and intact distal pulses.  Exam reveals no gallop and no friction rub.   No murmur heard. Pulmonary/Chest: Effort normal and breath sounds normal. No stridor. No respiratory distress. He has no wheezes. He has no rales. He exhibits no tenderness.  Abdominal: Soft. Bowel sounds are normal. He exhibits no distension and no mass. There is no tenderness. There is no rebound and no guarding.  Musculoskeletal: Normal range of motion. He exhibits no edema and no tenderness.  Lymphadenopathy:    He has no cervical adenopathy.  Neurological: He is oriented to person, place, and time.  Skin: Skin is warm and dry. No rash noted. He is not diaphoretic. No erythema. No pallor.  Psychiatric: He has a normal mood and affect. His behavior is normal. Judgment and thought content normal.     Lab Results  Component Value Date   WBC 4.9 08/12/2011   HGB 13.8 08/12/2011   HCT 40.1 08/12/2011   PLT 183.0 08/12/2011   GLUCOSE 94 08/12/2011   CHOL 174 08/12/2011   TRIG 254.0* 08/12/2011   HDL 33.30* 08/12/2011   LDLDIRECT 56.3 08/12/2011   ALT 27 08/12/2011   AST 20 08/12/2011   NA 142 08/12/2011   K 4.2 08/12/2011   CL 106 08/12/2011   CREATININE 1.1 08/12/2011   BUN 11 08/12/2011   CO2 30 08/12/2011   TSH 2.09 08/12/2011   PSA 1.93 08/12/2011       Assessment & Plan:

## 2011-09-16 NOTE — Patient Instructions (Signed)

## 2011-09-16 NOTE — Assessment & Plan Note (Signed)
His BP is well controlled 

## 2011-09-16 NOTE — Assessment & Plan Note (Signed)
He has not seen surgery yet, I did the referral but we were not able to reach him b/c we did not have the correct phone #, I corrected that today

## 2011-09-20 ENCOUNTER — Ambulatory Visit (INDEPENDENT_AMBULATORY_CARE_PROVIDER_SITE_OTHER): Payer: Federal, State, Local not specified - PPO | Admitting: General Surgery

## 2011-10-07 ENCOUNTER — Ambulatory Visit (INDEPENDENT_AMBULATORY_CARE_PROVIDER_SITE_OTHER): Payer: Federal, State, Local not specified - PPO | Admitting: Surgery

## 2011-10-07 ENCOUNTER — Encounter (INDEPENDENT_AMBULATORY_CARE_PROVIDER_SITE_OTHER): Payer: Self-pay | Admitting: Surgery

## 2011-10-07 VITALS — BP 134/96 | HR 72 | Temp 97.8°F | Resp 18 | Ht 71.0 in | Wt 224.8 lb

## 2011-10-07 DIAGNOSIS — K4021 Bilateral inguinal hernia, without obstruction or gangrene, recurrent: Secondary | ICD-10-CM

## 2011-10-07 DIAGNOSIS — K429 Umbilical hernia without obstruction or gangrene: Secondary | ICD-10-CM

## 2011-10-07 NOTE — Progress Notes (Signed)
Subjective:     Patient ID: Andre Stein, male   DOB: 1965-08-26, 46 y.o.   MRN: [id number]  HPI  Andre Stein  1965-12-19 [id number]  Patient Care Team: Etta Grandchild, MD as PCP - General  This patient is a 46 y.o.male who presents today for surgical evaluation at the request of Dr. Yetta Barre.   Reason for visit: Probable recurrent inguinal hernias  Patient is a pleasant obese male. He has had a bellybutton hernia for some time. He had a open bilateral inguinal hernia repairs in his 32s when he and his wife were newly married. He notes he is getting getting some groin pain on the left side. Occasional bulge as well. Naprosyn can help. Because it is getting more intense and keeping him from sleeping well, he and his wife are concerned. His primary care physician recommended considering surgery.  Patient has daily bowel movements. He does moderate activity at work. Pretty good physical tolerance. He has a history of chest pain but is related to reflux not to activity. No history of MRSA infections. Has a chronic rash on his posterior neck which is controlled with a topical steroid.  Patient Active Problem List  Diagnoses  . HYPERTENSION  . GERD  . CERVICAL RADICULOPATHY  . BACK PAIN, LUMBAR  . PARESTHESIA  . SLEEP APNEA  . Inguinal hernia recurrent bilateral  . Routine general medical examination at a health care facility    Past Medical History  Diagnosis Date  . Hypertension   . GERD (gastroesophageal reflux disease)   . Bilateral inguinal hernia   . Chest pain   . Abdominal pain     Past Surgical History  Procedure Date  . Hernia repair     History   Social History  . Marital Status: Married    Spouse Name: N/A    Number of Children: N/A  . Years of Education: N/A   Occupational History  . Not on file.   Social History Main Topics  . Smoking status: Never Smoker   . Smokeless tobacco: Never Used  . Alcohol Use: No  . Drug Use: No  . Sexually Active:  Yes   Other Topics Concern  . Not on file   Social History Narrative  . No narrative on file    Family History  Problem Relation Age of Onset  . Arthritis Other   . Diabetes Other   . Hypertension Other   . Stroke Other   . Heart disease Father     Current outpatient prescriptions:esomeprazole (NEXIUM) 40 MG capsule, Take 1 capsule (40 mg total) by mouth daily., Disp: 30 capsule, Rfl: 11;  flyticasone (CUTIVATE) 0.005 % ointment, Apply topically as needed. Apply to neck 1-2 times daily, Disp: , Rfl: ;  metoprolol succinate (TOPROL-XL) 25 MG 24 hr tablet, Take 1 tablet (25 mg total) by mouth daily., Disp: 30 tablet, Rfl: 11 naproxen (NAPROSYN) 500 MG tablet, Take 1 tablet (500 mg total) by mouth 2 (two) times daily with a meal., Disp: 60 tablet, Rfl: 11  No Known Allergies  BP 134/96  Pulse 72  Temp(Src) 97.8 F (36.6 C) (Temporal)  Resp 18  Ht 5\' 11"  (1.803 m)  Wt 224 lb 12.8 oz (101.969 kg)  BMI 31.35 kg/m2     Review of Systems  Constitutional: Negative for fever, chills and diaphoresis.  HENT: Negative for nosebleeds, sore throat, facial swelling, mouth sores, trouble swallowing and ear discharge.   Eyes: Negative for photophobia, discharge and  visual disturbance.  Respiratory: Negative for choking, chest tightness, shortness of breath and stridor.   Cardiovascular: Negative for chest pain and palpitations.  Gastrointestinal: Negative for nausea, vomiting, abdominal pain, diarrhea, constipation, blood in stool, abdominal distention, anal bleeding and rectal pain.  Genitourinary: Negative for dysuria, urgency, difficulty urinating and testicular pain.  Musculoskeletal: Negative for myalgias, back pain, arthralgias and gait problem.  Skin: Negative for color change, pallor, rash and wound.  Neurological: Negative for dizziness, speech difficulty, weakness, numbness and headaches.  Hematological: Negative for adenopathy. Does not bruise/bleed easily.    Psychiatric/Behavioral: Negative for hallucinations, confusion and agitation.       Objective:   Physical Exam  Constitutional: He is oriented to person, place, and time. He appears well-developed and well-nourished. No distress.  HENT:  Head: Normocephalic.  Mouth/Throat: Oropharynx is clear and moist. No oropharyngeal exudate.  Eyes: Conjunctivae and EOM are normal. Pupils are equal, round, and reactive to light. No scleral icterus.  Neck: Normal range of motion. Neck supple. No tracheal deviation present.  Cardiovascular: Normal rate, regular rhythm and intact distal pulses.   Pulmonary/Chest: Effort normal and breath sounds normal. No respiratory distress.  Abdominal: Soft. He exhibits no distension. There is no CVA tenderness. A hernia is present. Hernia confirmed positive in the ventral area, confirmed positive in the right inguinal area and confirmed positive in the left inguinal area.    Genitourinary: Testes normal and penis normal. Right testis shows no mass. Left testis shows no mass. Circumcised.  Musculoskeletal: Normal range of motion. He exhibits no tenderness.  Lymphadenopathy:    He has no cervical adenopathy.       Right: No inguinal adenopathy present.       Left: No inguinal adenopathy present.  Neurological: He is alert and oriented to person, place, and time. No cranial nerve deficit. He exhibits normal muscle tone. Coordination normal.  Skin: Skin is warm and dry. No rash noted. He is not diaphoretic. No erythema. No pallor.  Psychiatric: He has a normal mood and affect. His behavior is normal. Judgment and thought content normal.       Assessment:     Umb hernia, recurrent BIH    Plan:     Because of the left groin is painful and the umbilicus is sensitive, I recommend he consider surgery to repair these areas. I think it is reasonable to do a laparoscopic approach. I think the umbilical hernia is going to be too large to just do primary repair, probable  underlay laparoscopic patch as well. Should be able to do this in an outpatient setting.  The anatomy & physiology of the abdominal wall and pelvic floor was discussed.  The pathophysiology of hernias in the inguinal and abdominal region was discussed.  Natural history risks such as progressive enlargement, pain, incarceration & strangulation was discussed.   Contributors to complications such as smoking, obesity, diabetes, prior surgery, etc were discussed.    I feel the risks of no intervention will lead to serious problems that outweigh the operative risks; therefore, I recommended surgery to reduce and repair the hernia.  I explained laparoscopic techniques with possible need for an open approach.  I noted usual use of mesh to patch and/or buttress hernia repair  Risks such as bleeding, infection, abscess, need for further treatment, heart attack, death, and other risks were discussed.  I noted a good likelihood this will help address the problem.   Goals of post-operative recovery were discussed as well.  Possibility  that this will not correct all symptoms was explained.  I stressed the importance of low-impact activity, aggressive pain control, avoiding constipation, & not pushing through pain to minimize risk of post-operative chronic pain or injury. Possibility of reherniation was discussed.  We will work to minimize complications.     An educational handout further explaining the pathology & treatment options was given as well.  Questions were answered.  The patient expresses understanding & wishes to proceed with surgery.

## 2011-10-07 NOTE — Patient Instructions (Signed)
Managing Pain  Pain after surgery or related to activity is often due to strain/injury to muscle, tendon, nerves and/or incisions.  This pain is usually short-term and will improve in a few months.   Many people find it helpful to do the following things TOGETHER to help speed the process of healing and to get back to regular activity more quickly:  1. Avoid heavy physical activity a.  no lifting greater than 20 pounds b. Do not "push through" the pain.  Listen to your body and avoid positions and maneuvers than reproduce the pain c. Walking is okay as tolerated, but go slowly and stop when getting sore.  d. Remember: If it hurts to do it, then don't do it! 2. Take Anti-inflammatory medication  a. Take with food/snack around the clock for 1-2 weeks i. This helps the muscle and nerve tissues become less irritable and calm down faster b. Choose ONE of the following over-the-counter medications: i. Naproxen 220mg tabs (ex. Aleve) 1-2 pills twice a day  ii. Ibuprofen 200mg tabs (ex. Advil, Motrin) 3-4 pills with every meal and just before bedtime iii. Acetaminophen 500mg tabs (Tylenol) 1-2 pills with every meal and just before bedtime 3. Use a Heating pad or Ice/Cold Pack a. 4-6 times a day b. May use warm bath/hottub  or showers 4. Try Gentle Massage and/or Stretching  a. at the area of pain many times a day b. stop if you feel pain - do not overdo it  Try these steps together to help you body heal faster and avoid making things get worse.  Doing just one of these things may not be enough.    If you are not getting better after two weeks or are noticing you are getting worse, contact our office for further advice; we may need to re-evaluate you & see what other things we can do to help.  Hernia A hernia occurs when an internal organ pushes out through a weak spot in the abdominal wall. Hernias most commonly occur in the groin and around the navel. Hernias often can be pushed back into  place (reduced). Most hernias tend to get worse over time. Some abdominal hernias can get stuck in the opening (irreducible or incarcerated hernia) and cannot be reduced. An irreducible abdominal hernia which is tightly squeezed into the opening is at risk for impaired blood supply (strangulated hernia). A strangulated hernia is a medical emergency. Because of the risk for an irreducible or strangulated hernia, surgery may be recommended to repair a hernia. CAUSES   Heavy lifting.   Prolonged coughing.   Straining to have a bowel movement.   A cut (incision) made during an abdominal surgery.  HOME CARE INSTRUCTIONS   Bed rest is not required. You may continue your normal activities.   Avoid lifting more than 10 pounds (4.5 kg) or straining.   Cough gently. If you are a smoker it is best to stop. Even the best hernia repair can break down with the continual strain of coughing. Even if you do not have your hernia repaired, a cough will continue to aggravate the problem.   Do not wear anything tight over your hernia. Do not try to keep it in with an outside bandage or truss. These can damage abdominal contents if they are trapped within the hernia sac.   Eat a normal diet.   Avoid constipation. Straining over long periods of time will increase hernia size and encourage breakdown of repairs. If you cannot do this   with diet alone, stool softeners may be used.  SEEK IMMEDIATE MEDICAL CARE IF:   You have a fever.   You develop increasing abdominal pain.   You feel nauseous or vomit.   Your hernia is stuck outside the abdomen, looks discolored, feels hard, or is tender.   You have any changes in your bowel habits or in the hernia that are unusual for you.   You have increased pain or swelling around the hernia.   You cannot push the hernia back in place by applying gentle pressure while lying down.  MAKE SURE YOU:   Understand these instructions.   Will watch your condition.    Will get help right away if you are not doing well or get worse.  Document Released: 07/01/2005 Document Revised: 06/20/2011 Document Reviewed: 02/18/2008 [employer] Patient Information 2012 [employer]. 

## 2011-10-23 ENCOUNTER — Telehealth (INDEPENDENT_AMBULATORY_CARE_PROVIDER_SITE_OTHER): Payer: Self-pay

## 2011-10-23 NOTE — Telephone Encounter (Signed)
Called pt to check on him about the questions he had for surgery. The pt wanted to make sure that he was going to be out for 2wks with his surgery of bilateral ing. Hernia and umbilical hernia repairs. I did reassure the pt that he would be out of work for 2wks per DR Michaell Cowing. The pt understands.

## 2011-10-29 DIAGNOSIS — K4021 Bilateral inguinal hernia, without obstruction or gangrene, recurrent: Secondary | ICD-10-CM

## 2011-10-29 DIAGNOSIS — K429 Umbilical hernia without obstruction or gangrene: Secondary | ICD-10-CM

## 2011-10-29 HISTORY — PX: HERNIA REPAIR: SHX51

## 2011-10-29 HISTORY — PX: UMBILICAL HERNIA REPAIR: SHX196

## 2011-11-01 ENCOUNTER — Telehealth (INDEPENDENT_AMBULATORY_CARE_PROVIDER_SITE_OTHER): Payer: Self-pay

## 2011-11-01 NOTE — Telephone Encounter (Signed)
LMOM checking on him after surgery and giving pt his po appt with Dr Michaell Cowing on 11/19/11. Advised pt if any questions or concerns to call our office.

## 2011-11-08 ENCOUNTER — Encounter (INDEPENDENT_AMBULATORY_CARE_PROVIDER_SITE_OTHER): Payer: Self-pay

## 2011-11-19 ENCOUNTER — Ambulatory Visit (INDEPENDENT_AMBULATORY_CARE_PROVIDER_SITE_OTHER): Payer: Federal, State, Local not specified - PPO | Admitting: Surgery

## 2011-11-19 ENCOUNTER — Encounter (INDEPENDENT_AMBULATORY_CARE_PROVIDER_SITE_OTHER): Payer: Self-pay | Admitting: Surgery

## 2011-11-19 VITALS — BP 124/62 | HR 84 | Temp 97.2°F | Resp 14 | Ht 71.0 in | Wt 223.0 lb

## 2011-11-19 DIAGNOSIS — K429 Umbilical hernia without obstruction or gangrene: Secondary | ICD-10-CM

## 2011-11-19 DIAGNOSIS — K4021 Bilateral inguinal hernia, without obstruction or gangrene, recurrent: Secondary | ICD-10-CM

## 2011-11-19 NOTE — Progress Notes (Signed)
Subjective:     Patient ID: Andre Stein, male   DOB: 1965/11/23, 46 y.o.   MRN: [id number]  HPI  Andre Stein  11/16/1965 [id number]  Patient Care Team: Etta Grandchild, MD as PCP - General  This patient is a 46 y.o.male who presents today for surgical evaluation.   Procedure laparoscopic redo bilateral inguinal hernia repairs. Umbilical ventral hernia repairs. All with mesh. 10/29/2011  Patient comes in today with his wife. Feeling okay. Sore when he twists return in bed. Takes one ibuprofen every other day. His wife took his narcotics away as he was taking a lot. His constipation resolved. Eating okay. Concerns. His Band-Aids over everything.  Patient Active Problem List  Diagnoses  . HYPERTENSION  . GERD  . CERVICAL RADICULOPATHY  . BACK PAIN, LUMBAR  . PARESTHESIA  . SLEEP APNEA  . Inguinal hernia recurrent bilateral  . Routine general medical examination at a health care facility  . Hernia, umbilical    Past Medical History  Diagnosis Date  . Hypertension   . GERD (gastroesophageal reflux disease)   . Bilateral inguinal hernia   . Chest pain   . Abdominal pain     Past Surgical History  Procedure Date  . Hernia repair 10/29/11    BIH and lysis of adhesions  . Umbilical hernia repair   . Inguinal hernia repair     left     History   Social History  . Marital Status: Married    Spouse Name: N/A    Number of Children: N/A  . Years of Education: N/A   Occupational History  . Not on file.   Social History Main Topics  . Smoking status: Never Smoker   . Smokeless tobacco: Never Used  . Alcohol Use: No  . Drug Use: No  . Sexually Active: Yes   Other Topics Concern  . Not on file   Social History Narrative  . No narrative on file    Family History  Problem Relation Age of Onset  . Arthritis Other   . Diabetes Other   . Hypertension Other   . Stroke Other   . Heart disease Father   . Cancer Mother     pt believes it was colon     Current Outpatient Prescriptions  Medication Sig Dispense Refill  . esomeprazole (NEXIUM) 40 MG capsule Take 1 capsule (40 mg total) by mouth daily.  30 capsule  11  . flyticasone (CUTIVATE) 0.005 % ointment Apply topically as needed. Apply to neck 1-2 times daily      . metoprolol succinate (TOPROL-XL) 25 MG 24 hr tablet Take 1 tablet (25 mg total) by mouth daily.  30 tablet  11  . naproxen (NAPROSYN) 500 MG tablet Take 1 tablet (500 mg total) by mouth 2 (two) times daily with a meal.  60 tablet  11  . oxyCODONE (OXY IR/ROXICODONE) 5 MG immediate release tablet Ad lib.         No Known Allergies  BP 124/62  Pulse 84  Temp(Src) 97.2 F (36.2 C) (Temporal)  Resp 14  Ht 5\' 11"  (1.803 m)  Wt 223 lb (101.152 kg)  BMI 31.10 kg/m2     Review of Systems  Constitutional: Negative for fever, chills and diaphoresis.  HENT: Negative for sore throat, trouble swallowing and neck pain.   Eyes: Negative for photophobia and visual disturbance.  Respiratory: Negative for choking and shortness of breath.   Cardiovascular: Negative for chest pain and  palpitations.  Gastrointestinal: Positive for abdominal pain and constipation. Negative for nausea, vomiting, diarrhea, blood in stool, abdominal distention, anal bleeding and rectal pain.  Genitourinary: Negative for dysuria, urgency, difficulty urinating and testicular pain.  Musculoskeletal: Negative for myalgias, arthralgias and gait problem.  Skin: Negative for color change and rash.  Neurological: Negative for dizziness, speech difficulty, weakness and numbness.  Hematological: Negative for adenopathy.  Psychiatric/Behavioral: Negative for hallucinations, confusion and agitation.       Objective:   Physical Exam  Constitutional: He is oriented to person, place, and time. He appears well-developed and well-nourished. No distress.  HENT:  Head: Normocephalic.  Mouth/Throat: Oropharynx is clear and moist. No oropharyngeal exudate.   Eyes: Conjunctivae and EOM are normal. Pupils are equal, round, and reactive to light. No scleral icterus.  Neck: Normal range of motion. Neck supple. No tracheal deviation present.  Cardiovascular: Normal rate, regular rhythm and intact distal pulses.   Pulmonary/Chest: Effort normal and breath sounds normal. No respiratory distress.  Abdominal: Soft. He exhibits no distension. There is no tenderness. Hernia confirmed negative in the right inguinal area and confirmed negative in the left inguinal area.  Musculoskeletal: Normal range of motion. He exhibits no tenderness.  Lymphadenopathy:    He has no cervical adenopathy.       Right: No inguinal adenopathy present.       Left: No inguinal adenopathy present.  Neurological: He is alert and oriented to person, place, and time. No cranial nerve deficit. He exhibits normal muscle tone. Coordination normal.  Skin: Skin is warm and dry. No rash noted. He is not diaphoretic. No erythema. No pallor.  Psychiatric: He has a normal mood and affect. His behavior is normal. Judgment and thought content normal.       Assessment:     3 weeks s/p lap redo BIH repairs & periumb VWH repairs w mesh, slowly recovering    Plan:     Increase activity as tolerated.  Do not push through pain.  Advanced on diet as tolerated. Bowel regimen to avoid problems.  Return to clinic 3weeks. The patient & wife expressed understanding and appreciation

## 2011-11-19 NOTE — Patient Instructions (Signed)
Managing Pain  Pain after surgery or related to activity is often due to strain/injury to muscle, tendon, nerves and/or incisions.  This pain is usually short-term and will improve in a few months.   Many people find it helpful to do the following things TOGETHER to help speed the process of healing and to get back to regular activity more quickly:  1. Avoid heavy physical activity a.  no lifting greater than 20 pounds b. Do not "push through" the pain.  Listen to your body and avoid positions and maneuvers than reproduce the pain c. Walking is okay as tolerated, but go slowly and stop when getting sore.  d. Remember: If it hurts to do it, then don't do it! 2. Take Anti-inflammatory medication  a. Take with food/snack around the clock for 1-2 weeks i. This helps the muscle and nerve tissues become less irritable and calm down faster b. Choose ONE of the following over-the-counter medications: i. Naproxen 220mg tabs (ex. Aleve) 1-2 pills twice a day  ii. Ibuprofen 200mg tabs (ex. Advil, Motrin) 3-4 pills with every meal and just before bedtime iii. Acetaminophen 500mg tabs (Tylenol) 1-2 pills with every meal and just before bedtime 3. Use a Heating pad or Ice/Cold Pack a. 4-6 times a day b. May use warm bath/hottub  or showers 4. Try Gentle Massage and/or Stretching  a. at the area of pain many times a day b. stop if you feel pain - do not overdo it  Try these steps together to help you body heal faster and avoid making things get worse.  Doing just one of these things may not be enough.    If you are not getting better after two weeks or are noticing you are getting worse, contact our office for further advice; we may need to re-evaluate you & see what other things we can do to help.  GETTING TO GOOD BOWEL HEALTH. Irregular bowel habits such as constipation and diarrhea can lead to many problems over time.  Having one soft bowel movement a day is the most important way to prevent  further problems.  The anorectal canal is designed to handle stretching and feces to safely manage our ability to get rid of solid waste (feces, poop, stool) out of our body.  BUT, hard constipated stools can act like ripping concrete bricks and diarrhea can be a burning fire to this very sensitive area of our body, causing inflamed hemorrhoids, anal fissures, increasing risk is perirectal abscesses, abdominal pain/bloating, an making irritable bowel worse.     The goal: ONE SOFT BOWEL MOVEMENT A DAY!  To have soft, regular bowel movements:    Drink at least 8 tall glasses of water a day.     Take plenty of fiber.  Fiber is the undigested part of plant food that passes into the colon, acting s "natures broom" to encourage bowel motility and movement.  Fiber can absorb and hold large amounts of water. This results in a larger, bulkier stool, which is soft and easier to pass. Work gradually over several weeks up to 6 servings a day of fiber (25g a day even more if needed) in the form of: o Vegetables -- Root (potatoes, carrots, turnips), leafy green (lettuce, salad greens, celery, spinach), or cooked high residue (cabbage, broccoli, etc) o Fruit -- Fresh (unpeeled skin & pulp), Dried (prunes, apricots, cherries, etc ),  or stewed ( applesauce)  o Whole grain breads, pasta, etc (whole wheat)  o Bran cereals      Bulking Agents -- This type of water-retaining fiber generally is easily obtained each day by one of the following:  o Psyllium bran -- The psyllium plant is remarkable because its ground seeds can retain so much water. This product is available as Metamucil, Konsyl, Effersyllium, Per Diem Fiber, or the less expensive generic preparation in drug and health food stores. Although labeled a laxative, it really is not a laxative.  o Methylcellulose -- This is another fiber derived from wood which also retains water. It is available as Citrucel. o Polyethylene Glycol - and "artificial" fiber commonly called  Miralax or Glycolax.  It is helpful for people with gassy or bloated feelings with regular fiber o Flax Seed - a less gassy fiber than psyllium   No reading or other relaxing activity while on the toilet. If bowel movements take longer than 5 minutes, you are too constipated   AVOID CONSTIPATION.  High fiber and water intake usually takes care of this.  Sometimes a laxative is needed to stimulate more frequent bowel movements, but    Laxatives are not a good long-term solution as it can wear the colon out. o Osmotics (Milk of Magnesia, Fleets phosphosoda, Magnesium citrate, MiraLax, GoLytely) are safer than  o Stimulants (Senokot, Castor Oil, Dulcolax, Ex Lax)    o Do not take laxatives for more than 7days in a row.    IF SEVERELY CONSTIPATED, try a Bowel Retraining Program: o Do not use laxatives.  o Eat a diet high in roughage, such as bran cereals and leafy vegetables.  o Drink six (6) ounces of prune or apricot juice each morning.  o Eat two (2) large servings of stewed fruit each day.  o Take one (1) heaping tablespoon of a psyllium-based bulking agent twice a day. Use sugar-free sweetener when possible to avoid excessive calories.  o Eat a normal breakfast.  o Set aside 15 minutes after breakfast to sit on the toilet, but do not strain to have a bowel movement.  o If you do not have a bowel movement by the third day, use an enema and repeat the above steps.    Controlling diarrhea o Switch to liquids and simpler foods for a few days to avoid stressing your intestines further. o Avoid dairy products (especially milk & ice cream) for a short time.  The intestines often can lose the ability to digest lactose when stressed. o Avoid foods that cause gassiness or bloating.  Typical foods include beans and other legumes, cabbage, broccoli, and dairy foods.  Every person has some sensitivity to other foods, so listen to our body and avoid those foods that trigger problems for you. o Adding fiber  (Citrucel, Metamucil, psyllium, Miralax) gradually can help thicken stools by absorbing excess fluid and retrain the intestines to act more normally.  Slowly increase the dose over a few weeks.  Too much fiber too soon can backfire and cause cramping & bloating. o Probiotics (such as active yogurt, Align, etc) may help repopulate the intestines and colon with normal bacteria and calm down a sensitive digestive tract.  Most studies show it to be of mild help, though, and such products can be costly. o Medicines:   Bismuth subsalicylate (ex. Kayopectate, Pepto Bismol) every 30 minutes for up to 6 doses can help control diarrhea.  Avoid if pregnant.   Loperamide (Immodium) can slow down diarrhea.  Start with two tablets (4mg total) first and then try one tablet every 6 hours.  Avoid if you   are having fevers or severe pain.  If you are not better or start feeling worse, stop all medicines and call your doctor for advice o Call your doctor if you are getting worse or not better.  Sometimes further testing (cultures, endoscopy, X-ray studies, bloodwork, etc) may be needed to help diagnose and treat the cause of the diarrhea. o  

## 2011-12-04 ENCOUNTER — Ambulatory Visit (INDEPENDENT_AMBULATORY_CARE_PROVIDER_SITE_OTHER): Payer: Federal, State, Local not specified - PPO | Admitting: Surgery

## 2011-12-04 ENCOUNTER — Encounter (INDEPENDENT_AMBULATORY_CARE_PROVIDER_SITE_OTHER): Payer: Self-pay | Admitting: Surgery

## 2011-12-04 VITALS — BP 136/90 | HR 70 | Temp 97.4°F | Resp 12 | Ht 71.0 in | Wt 224.2 lb

## 2011-12-04 DIAGNOSIS — K429 Umbilical hernia without obstruction or gangrene: Secondary | ICD-10-CM

## 2011-12-04 DIAGNOSIS — K4021 Bilateral inguinal hernia, without obstruction or gangrene, recurrent: Secondary | ICD-10-CM

## 2011-12-04 NOTE — Patient Instructions (Signed)
Managing Pain  Pain after surgery or related to activity is often due to strain/injury to muscle, tendon, nerves and/or incisions.  This pain is usually short-term and will improve in a few months.   Many people find it helpful to do the following things TOGETHER to help speed the process of healing and to get back to regular activity more quickly:  1. Avoid heavy physical activity a.  no lifting greater than 20 pounds b. Do not "push through" the pain.  Listen to your body and avoid positions and maneuvers than reproduce the pain c. Walking is okay as tolerated, but go slowly and stop when getting sore.  d. Remember: If it hurts to do it, then don't do it! 2. Take Anti-inflammatory medication  a. Take with food/snack around the clock for 1-2 weeks i. This helps the muscle and nerve tissues become less irritable and calm down faster b. Choose ONE of the following over-the-counter medications: i. Naproxen 220mg  tabs (ex. Aleve) 2 pills twice a day  ii. Ibuprofen 200mg  tabs (ex. Advil, Motrin) 4 pills with every meal and just before bedtime iii. Acetaminophen 500mg  tabs (Tylenol) 2 pills with every meal and just before bedtime 3. Use a Heating pad or Ice/Cold Pack a. 4-6 times a day b. May use warm bath/hottub  or showers 4. Try Gentle Massage and/or Stretching  a. at the area of pain many times a day b. stop if you feel pain - do not overdo it  Try these steps together to help you body heal faster and avoid making things get worse.  Doing just one of these things may not be enough.    If you are not getting better after two weeks or are noticing you are getting worse, contact our office for further advice; we may need to re-evaluate you & see what other things we can do to help.

## 2011-12-05 NOTE — Progress Notes (Signed)
Subjective:     Patient ID: Andre Stein, male   DOB: 1966-01-24, 46 y.o.   MRN: [id number]  HPI  Andre Stein  1966-04-26 [id number]  Patient Care Team: Etta Grandchild, MD as PCP - General  This patient is a 46 y.o.male who presents today for surgical evaluation.   Procedure: Arthroscopic bilateral inguinal hernia [place] and periumbilical ventral hernia repairs with mesh 10/29/2011  The patient comes in today gradually getting better. Still having soreness on the left side. Uncomfortable to lie on his left side. He's back to work doing some light duty. He is afraid to go unrestricted. Using nonsteroidals alternating between ibuprofen and Aleve. Moving bowels regularly. Urinating fine. Energy level good. Appetite good. Perseverating on concerns about getting back to full duty work safely  Patient Active Problem List  Diagnoses  . HYPERTENSION  . GERD  . CERVICAL RADICULOPATHY  . BACK PAIN, LUMBAR  . PARESTHESIA  . SLEEP APNEA  . Inguinal hernia recurrent bilateral  . Routine general medical examination at a health care facility  . Hernia, umbilical    Past Medical History  Diagnosis Date  . Hypertension   . GERD (gastroesophageal reflux disease)   . Bilateral inguinal hernia   . Chest pain   . Abdominal pain   . Umbilical hernia     ventral    Past Surgical History  Procedure Date  . Umbilical hernia repair 10/29/11  . Hernia repair 10/29/11    BIH and lysis of adhesions  . Inguinal hernia repair     left     History   Social History  . Marital Status: Married    Spouse Name: N/A    Number of Children: N/A  . Years of Education: N/A   Occupational History  . Not on file.   Social History Main Topics  . Smoking status: Never Smoker   . Smokeless tobacco: Never Used  . Alcohol Use: No  . Drug Use: No  . Sexually Active: Yes   Other Topics Concern  . Not on file   Social History Narrative  . No narrative on file    Family History  Problem  Relation Age of Onset  . Arthritis Other   . Diabetes Other   . Hypertension Other   . Stroke Other   . Heart disease Father   . Cancer Mother     pt believes it was colon    Current Outpatient Prescriptions  Medication Sig Dispense Refill  . esomeprazole (NEXIUM) 40 MG capsule Take 1 capsule (40 mg total) by mouth daily.  30 capsule  11  . flyticasone (CUTIVATE) 0.005 % ointment Apply topically as needed. Apply to neck 1-2 times daily      . metoprolol succinate (TOPROL-XL) 25 MG 24 hr tablet Take 1 tablet (25 mg total) by mouth daily.  30 tablet  11  . naproxen (NAPROSYN) 500 MG tablet Take 1 tablet (500 mg total) by mouth 2 (two) times daily with a meal.  60 tablet  11  . oxyCODONE (OXY IR/ROXICODONE) 5 MG immediate release tablet Ad lib.         No Known Allergies  BP 136/90  Pulse 70  Temp(Src) 97.4 F (36.3 C) (Temporal)  Resp 12  Ht 5\' 11"  (1.803 m)  Wt 224 lb 4 oz (101.719 kg)  BMI 31.28 kg/m2  No results found.   Review of Systems  Constitutional: Negative for fever, chills and diaphoresis.  HENT: Negative for sore  throat, trouble swallowing and neck pain.   Eyes: Negative for photophobia and visual disturbance.  Respiratory: Negative for choking and shortness of breath.   Cardiovascular: Negative for chest pain and palpitations.  Gastrointestinal: Negative for nausea, vomiting, abdominal distention, anal bleeding and rectal pain.  Genitourinary: Negative for dysuria, urgency, difficulty urinating and testicular pain.  Musculoskeletal: Negative for myalgias, arthralgias and gait problem.  Skin: Negative for color change and rash.  Neurological: Negative for dizziness, speech difficulty, weakness and numbness.  Hematological: Negative for adenopathy.  Psychiatric/Behavioral: Negative for hallucinations, confusion and agitation.       Objective:   Physical Exam  Constitutional: He is oriented to person, place, and time. He appears well-developed and  well-nourished. No distress.  HENT:  Head: Normocephalic.  Mouth/Throat: Oropharynx is clear and moist. No oropharyngeal exudate.  Eyes: Conjunctivae and EOM are normal. Pupils are equal, round, and reactive to light. No scleral icterus.  Neck: Normal range of motion. No tracheal deviation present.  Cardiovascular: Normal rate, normal heart sounds and intact distal pulses.   Pulmonary/Chest: Effort normal. No respiratory distress.  Abdominal: Soft. He exhibits no distension. Hernia confirmed negative in the right inguinal area and confirmed negative in the left inguinal area.       Incisions clean with normal healing ridges.  No hernias.  Mild soreness in the left paramedian region. No cellulitis or abscess.  Musculoskeletal: Normal range of motion. He exhibits no tenderness.  Neurological: He is alert and oriented to person, place, and time. No cranial nerve deficit. He exhibits normal muscle tone. Coordination normal.  Skin: Skin is warm and dry. No rash noted. He is not diaphoretic.  Psychiatric: He has a normal mood and affect. His behavior is normal.       Assessment:     Obese male with heavy-duty job now one month status post repair of 3 hernias was still some soreness in the left paramedian region.    Plan:     Increase activity as tolerated.  Do not push through pain.  Okay to continue light duty until a few more weeks. Unrestricted activity on first Monday of early June = 12 more days of light duty. Paperwork was filled out. A letter was written. Consider adding warm baths and heat to the regimen. Narcotics for back up. Okay to do light duty exercise to help keep weight down and improved.  Advanced on diet as tolerated. Bowel regimen to avoid problems.  Return to clinic p.r.n. The patient expressed understanding and appreciation

## 2012-10-09 ENCOUNTER — Other Ambulatory Visit: Payer: Self-pay | Admitting: Internal Medicine

## 2012-10-26 ENCOUNTER — Other Ambulatory Visit: Payer: Self-pay | Admitting: Internal Medicine

## 2012-10-30 ENCOUNTER — Ambulatory Visit (INDEPENDENT_AMBULATORY_CARE_PROVIDER_SITE_OTHER): Payer: Federal, State, Local not specified - PPO | Admitting: Internal Medicine

## 2012-10-30 ENCOUNTER — Encounter: Payer: Self-pay | Admitting: Internal Medicine

## 2012-10-30 ENCOUNTER — Other Ambulatory Visit (INDEPENDENT_AMBULATORY_CARE_PROVIDER_SITE_OTHER): Payer: Federal, State, Local not specified - PPO

## 2012-10-30 VITALS — BP 122/86 | HR 64 | Temp 97.6°F | Resp 16 | Ht 71.0 in | Wt 227.0 lb

## 2012-10-30 DIAGNOSIS — K219 Gastro-esophageal reflux disease without esophagitis: Secondary | ICD-10-CM

## 2012-10-30 DIAGNOSIS — Z0000 Encounter for general adult medical examination without abnormal findings: Secondary | ICD-10-CM

## 2012-10-30 DIAGNOSIS — I10 Essential (primary) hypertension: Secondary | ICD-10-CM

## 2012-10-30 DIAGNOSIS — E781 Pure hyperglyceridemia: Secondary | ICD-10-CM | POA: Insufficient documentation

## 2012-10-30 LAB — CBC WITH DIFFERENTIAL/PLATELET
Basophils Relative: 0.6 % (ref 0.0–3.0)
Hemoglobin: 13.9 g/dL (ref 13.0–17.0)
Lymphocytes Relative: 28.3 % (ref 12.0–46.0)
Monocytes Relative: 10 % (ref 3.0–12.0)
Neutro Abs: 2.7 10*3/uL (ref 1.4–7.7)
Neutrophils Relative %: 57.5 % (ref 43.0–77.0)
RBC: 4.73 Mil/uL (ref 4.22–5.81)
WBC: 4.7 10*3/uL (ref 4.5–10.5)

## 2012-10-30 LAB — LIPID PANEL
HDL: 31.7 mg/dL — ABNORMAL LOW (ref >39.00)
Total CHOL/HDL Ratio: 5
Triglycerides: 177 mg/dL — ABNORMAL HIGH (ref 0.0–149.0)

## 2012-10-30 LAB — COMPREHENSIVE METABOLIC PANEL
AST: 22 U/L (ref 0–37)
Albumin: 4 g/dL (ref 3.5–5.2)
BUN: 14 mg/dL (ref 6–23)
CO2: 33 mEq/L — ABNORMAL HIGH (ref 19–32)
Calcium: 9.6 mg/dL (ref 8.4–10.5)
Chloride: 106 mEq/L (ref 96–112)
Creatinine, Ser: 1.3 mg/dL (ref 0.4–1.5)
GFR: 79.18 mL/min (ref >60.00)
Glucose, Bld: 92 mg/dL (ref 70–99)

## 2012-10-30 LAB — URINALYSIS, ROUTINE W REFLEX MICROSCOPIC
Bilirubin Urine: NEGATIVE
Hgb urine dipstick: NEGATIVE
Urine Glucose: NEGATIVE
Urobilinogen, UA: 0.2 (ref 0.0–1.0)

## 2012-10-30 MED ORDER — ESOMEPRAZOLE MAGNESIUM 40 MG PO CPDR: 40.0000 mg | DELAYED_RELEASE_CAPSULE | Freq: Every day | ORAL | Status: DC

## 2012-10-30 MED ORDER — METOPROLOL SUCCINATE ER 25 MG PO TB24: 25.0000 mg | ORAL_TABLET | Freq: Every day | ORAL | Status: DC

## 2012-10-30 NOTE — Progress Notes (Signed)
Subjective:    Patient ID: Andre Stein, male    DOB: [birth year], 47 y.o.   MRN: [id number]  Gastrophageal Reflux He complains of belching and heartburn. He reports no abdominal pain, no chest pain, no choking, no coughing, no dysphagia, no early satiety, no globus sensation, no hoarse voice, no nausea, no sore throat, no stridor, no tooth decay, no water brash or no wheezing. This is a chronic problem. The current episode started more than 1 year ago. The problem occurs frequently. The problem has been unchanged. The heartburn duration is several minutes. The heartburn is located in the substernum. The heartburn is of moderate intensity. The heartburn does not wake him from sleep. The heartburn does not limit his activity. The heartburn doesn't change with position. The symptoms are aggravated by certain foods. Pertinent negatives include no anemia, fatigue, melena, muscle weakness, orthopnea or weight loss. Risk factors include NSAIDs. He has tried an antacid (tums) for the symptoms. The treatment provided moderate relief.      Review of Systems  Constitutional: Negative.  Negative for fever, chills, weight loss, diaphoresis, activity change, appetite change, fatigue and unexpected weight change.  HENT: Positive for neck pain (improving). Negative for sore throat and hoarse voice.   Eyes: Negative.   Respiratory: Negative.  Negative for apnea, cough, choking, chest tightness, shortness of breath, wheezing and stridor.   Cardiovascular: Negative.  Negative for chest pain, palpitations and leg swelling.  Gastrointestinal: Positive for heartburn. Negative for dysphagia, nausea, vomiting, abdominal pain, diarrhea, constipation, blood in stool, melena, abdominal distention, anal bleeding and rectal pain.  Endocrine: Negative.   Genitourinary: Negative.   Musculoskeletal: Positive for back pain (chronic,unchanged). Negative for myalgias, joint swelling, arthralgias, gait problem and muscle  weakness.  Skin: Negative.   Allergic/Immunologic: Negative.   Neurological: Negative.  Negative for dizziness, syncope, weakness, light-headedness and headaches.  Hematological: Negative.   Psychiatric/Behavioral: Negative.        Objective:   Physical Exam  Vitals reviewed. Constitutional: He is oriented to person, place, and time. He appears well-developed and well-nourished. No distress.  HENT:  Head: Normocephalic and atraumatic.  Mouth/Throat: Oropharynx is clear and moist. No oropharyngeal exudate.  Eyes: Conjunctivae are normal. Right eye exhibits no discharge. Left eye exhibits no discharge. No scleral icterus.  Neck: Normal range of motion. Neck supple. No JVD present. No tracheal deviation present. No thyromegaly present.  Cardiovascular: Normal rate, regular rhythm, normal heart sounds and intact distal pulses.  Exam reveals no gallop and no friction rub.   No murmur heard. Pulmonary/Chest: Effort normal and breath sounds normal. No stridor. No respiratory distress. He has no wheezes. He has no rales. He exhibits no tenderness.  Abdominal: Soft. Bowel sounds are normal. He exhibits no distension and no mass. There is no tenderness. There is no rebound and no guarding. Hernia confirmed negative in the right inguinal area and confirmed negative in the left inguinal area.  Genitourinary: Rectum normal, prostate normal and penis normal. Rectal exam shows no external hemorrhoid, no internal hemorrhoid, no fissure, no mass, no tenderness and anal tone normal. Guaiac negative stool. Prostate is not enlarged and not tender. Right testis shows no mass, no swelling and no tenderness. Right testis is descended. Left testis shows no mass, no swelling and no tenderness. Left testis is descended. Uncircumcised. No phimosis, paraphimosis, hypospadias, penile erythema or penile tenderness. No discharge found.  Musculoskeletal: Normal range of motion. He exhibits no edema.  Lymphadenopathy:    He  has no  cervical adenopathy.       Right: No inguinal adenopathy present.       Left: No inguinal adenopathy present.  Neurological: He is oriented to person, place, and time.  Skin: Skin is warm and dry. No rash noted. He is not diaphoretic. No erythema. No pallor.  Psychiatric: He has a normal mood and affect. His behavior is normal. Judgment and thought content normal.     Lab Results  Component Value Date   WBC 4.9 08/12/2011   HGB 13.8 08/12/2011   HCT 40.1 08/12/2011   PLT 183.0 08/12/2011   GLUCOSE 94 08/12/2011   CHOL 174 08/12/2011   TRIG 254.0* 08/12/2011   HDL 33.30* 08/12/2011   LDLDIRECT 56.3 08/12/2011   ALT 27 08/12/2011   AST 20 08/12/2011   NA 142 08/12/2011   K 4.2 08/12/2011   CL 106 08/12/2011   CREATININE 1.1 08/12/2011   BUN 11 08/12/2011   CO2 30 08/12/2011   TSH 2.09 08/12/2011   PSA 1.93 08/12/2011       Assessment & Plan:

## 2012-10-30 NOTE — Patient Instructions (Signed)
Health Maintenance, Males A healthy lifestyle and preventative care can promote health and wellness.  Maintain regular health, dental, and eye exams.  Eat a healthy diet. Foods like vegetables, fruits, whole grains, low-fat dairy products, and lean protein foods contain the nutrients you need without too many calories. Decrease your intake of foods high in solid fats, added sugars, and salt. Get information about a proper diet from your caregiver, if necessary.  Regular physical exercise is one of the most important things you can do for your health. Most adults should get at least 150 minutes of moderate-intensity exercise (any activity that increases your heart rate and causes you to sweat) each week. In addition, most adults need muscle-strengthening exercises on 2 or more days a week.   Maintain a healthy weight. The body mass index (BMI) is a screening tool to identify possible weight problems. It provides an estimate of body fat based on height and weight. Your caregiver can help determine your BMI, and can help you achieve or maintain a healthy weight. For adults 20 years and older:  A BMI below 18.5 is considered underweight.  A BMI of 18.5 to 24.9 is normal.  A BMI of 25 to 29.9 is considered overweight.  A BMI of 30 and above is considered obese.  Maintain normal blood lipids and cholesterol by exercising and minimizing your intake of saturated fat. Eat a balanced diet with plenty of fruits and vegetables. Blood tests for lipids and cholesterol should begin at age 20 and be repeated every 5 years. If your lipid or cholesterol levels are high, you are over 50, or you are a high risk for heart disease, you may need your cholesterol levels checked more frequently.Ongoing high lipid and cholesterol levels should be treated with medicines, if diet and exercise are not effective.  If you smoke, find out from your caregiver how to quit. If you do not use tobacco, do not start.  If you  choose to drink alcohol, do not exceed 2 drinks per day. One drink is considered to be 12 ounces (355 mL) of beer, 5 ounces (148 mL) of wine, or 1.5 ounces (44 mL) of liquor.  Avoid use of street drugs. Do not share needles with anyone. Ask for help if you need support or instructions about stopping the use of drugs.  High blood pressure causes heart disease and increases the risk of stroke. Blood pressure should be checked at least every 1 to 2 years. Ongoing high blood pressure should be treated with medicines if weight loss and exercise are not effective.  If you are 45 to 49 years old, ask your caregiver if you should take aspirin to prevent heart disease.  Diabetes screening involves taking a blood sample to check your fasting blood sugar level. This should be done once every 3 years, after age 45, if you are within normal weight and without risk factors for diabetes. Testing should be considered at a younger age or be carried out more frequently if you are overweight and have at least 1 risk factor for diabetes.  Colorectal cancer can be detected and often prevented. Most routine colorectal cancer screening begins at the age of 50 and continues through age 75. However, your caregiver may recommend screening at an earlier age if you have risk factors for colon cancer. On a yearly basis, your caregiver may provide home test kits to check for hidden blood in the stool. Use of a small camera at the end of a tube,   to directly examine the colon (sigmoidoscopy or colonoscopy), can detect the earliest forms of colorectal cancer. Talk to your caregiver about this at age 50, when routine screening begins. Direct examination of the colon should be repeated every 5 to 10 years through age 75, unless early forms of pre-cancerous polyps or small growths are found.  Hepatitis C blood testing is recommended for all people born from 1945 through 1965 and any individual with known risks for hepatitis C.  Healthy  men should no longer receive prostate-specific antigen (PSA) blood tests as part of routine cancer screening. Consult with your caregiver about prostate cancer screening.  Testicular cancer screening is not recommended for adolescents or adult males who have no symptoms. Screening includes self-exam, caregiver exam, and other screening tests. Consult with your caregiver about any symptoms you have or any concerns you have about testicular cancer.  Practice safe sex. Use condoms and avoid high-risk sexual practices to reduce the spread of sexually transmitted infections (STIs).  Use sunscreen with a sun protection factor (SPF) of 30 or greater. Apply sunscreen liberally and repeatedly throughout the day. You should seek shade when your shadow is shorter than you. Protect yourself by wearing long sleeves, pants, a wide-brimmed hat, and sunglasses year round, whenever you are outdoors.  Notify your caregiver of new moles or changes in moles, especially if there is a change in shape or color. Also notify your caregiver if a mole is larger than the size of a pencil eraser.  A one-time screening for abdominal aortic aneurysm (AAA) and surgical repair of large AAAs by sound wave imaging (ultrasonography) is recommended for ages 65 to 75 years who are current or former smokers.  Stay current with your immunizations. Document Released: 12/28/2007 Document Revised: 09/23/2011 Document Reviewed: 11/26/2010 [employer] Patient Information 2013 [employer].  

## 2012-11-01 NOTE — Assessment & Plan Note (Signed)
Exam done Labs ordered Vaccines were reviewed Pt ed material was given 

## 2012-11-01 NOTE — Assessment & Plan Note (Signed)
I will recheck his FLP today He will improve his lifestyle modifications

## 2012-11-01 NOTE — Assessment & Plan Note (Signed)
His EKG is normal - no LVH His BP is well controlled

## 2012-11-01 NOTE — Assessment & Plan Note (Signed)
He will restart nexium

## 2013-02-17 ENCOUNTER — Telehealth: Payer: Self-pay | Admitting: Internal Medicine

## 2013-02-17 NOTE — Telephone Encounter (Signed)
Wife is calling because she states pharmacy has sent two requests for patients Nexium to be refilled, [facility] on [address]

## 2013-02-17 NOTE — Telephone Encounter (Signed)
Medication was refilled 10/2012 for 90 day with refills and receipt confirmed by pharmacy. Insurance has requested a PA for Nexium, form completed, faxed back and not pending insurance approval. LMOVM advising of this information

## 2013-06-06 ENCOUNTER — Encounter (HOSPITAL_COMMUNITY): Payer: Self-pay | Admitting: Emergency Medicine

## 2013-06-06 ENCOUNTER — Emergency Department (HOSPITAL_COMMUNITY): Payer: Federal, State, Local not specified - PPO

## 2013-06-06 ENCOUNTER — Emergency Department (HOSPITAL_COMMUNITY)
Admission: EM | Admit: 2013-06-06 | Discharge: 2013-06-07 | Disposition: A | Discharge status: Home / Self Care | Payer: Federal, State, Local not specified - PPO | Attending: Emergency Medicine | Admitting: Emergency Medicine

## 2013-06-06 DIAGNOSIS — H9209 Otalgia, unspecified ear: Secondary | ICD-10-CM | POA: Insufficient documentation

## 2013-06-06 DIAGNOSIS — H9319 Tinnitus, unspecified ear: Secondary | ICD-10-CM | POA: Insufficient documentation

## 2013-06-06 DIAGNOSIS — J069 Acute upper respiratory infection, unspecified: Secondary | ICD-10-CM | POA: Insufficient documentation

## 2013-06-06 DIAGNOSIS — Z791 Long term (current) use of non-steroidal anti-inflammatories (NSAID): Secondary | ICD-10-CM | POA: Insufficient documentation

## 2013-06-06 DIAGNOSIS — K219 Gastro-esophageal reflux disease without esophagitis: Secondary | ICD-10-CM | POA: Insufficient documentation

## 2013-06-06 DIAGNOSIS — I10 Essential (primary) hypertension: Secondary | ICD-10-CM | POA: Insufficient documentation

## 2013-06-06 DIAGNOSIS — Z79899 Other long term (current) drug therapy: Secondary | ICD-10-CM | POA: Insufficient documentation

## 2013-06-06 NOTE — ED Notes (Signed)
C/o bilateral ear ringing and "feels clogged", L>R, denies "pain". Ongoing for ~ 2 weeks. Describes as getting worse. Started with cold sx, had taken mucinex (on 2nd bottle of mucinex now), mentions sinuses and sore throat. Denies sore throat at this time, but describes post nasal gtt.

## 2013-06-06 NOTE — ED Notes (Signed)
See triage note pa saw before myself

## 2013-06-06 NOTE — ED Provider Notes (Signed)
CSN: [id number]     Arrival date & time 06/06/13  2212 History   None    This chart was scribed for non-physician practitioner, Roxy Horseman PA-C, working with Roney Marion, MD by Arlan Organ, ED Scribe. This patient was seen in room TR04C/TR04C and the patient's care was started at 11:24 PM.   Chief Complaint  Patient presents with  . Otalgia  . Sore Throat  . Nasal Congestion    The history is provided by the patient. No language interpreter was used.   HPI Comments: Andre Stein is a 47 y.o. male with a hx of HTN, GERD, and seasonal allergies who presents to the Emergency Department complaining of gradual onset, gradually worsening, cough, nasal congestion, and ear congestion that started 2 weeks ago. Pt states his symptoms started with a productive cough consisting of yellow sputum, nasal congestion, sore throat, and ear fullness. He has tried mucinex and ear drops with mild relief. He reports some mild tinnitus. He states some of his symptoms resolved over time, while others are still lingering. Currently he states he is still experiencing a cough, throat irritation, and ear fullness. He denies hemoptysis or sinus pressure.  He denies fever, chills, nausea, or emesis. Pt states he typically takes Claritin regularly, but has recently ran out.   Past Medical History  Diagnosis Date  . Hypertension   . GERD (gastroesophageal reflux disease)   . Bilateral inguinal hernia   . Chest pain   . Abdominal pain   . Umbilical hernia     ventral   Past Surgical History  Procedure Laterality Date  . Umbilical hernia repair  10/29/11  . Hernia repair  10/29/11    BIH and lysis of adhesions  . Inguinal hernia repair      left    Family History  Problem Relation Age of Onset  . Arthritis Other   . Diabetes Other   . Hypertension Other   . Stroke Other   . Heart disease Father   . Cancer Mother     pt believes it was colon   History  Substance Use Topics  . Smoking status:  Never Smoker   . Smokeless tobacco: Never Used  . Alcohol Use: No    Review of Systems A complete 10 system review of systems was obtained and all systems are negative except as noted in the HPI and PMH.    Allergies  Review of patient's allergies indicates no known allergies.  Home Medications   Current Outpatient Rx  Name  Route  Sig  Dispense  Refill  . esomeprazole (NEXIUM) 40 MG capsule   Oral   Take 1 capsule (40 mg total) by mouth daily.   90 capsule   3   . metoprolol succinate (TOPROL-XL) 25 MG 24 hr tablet   Oral   Take 1 tablet (25 mg total) by mouth daily.   90 tablet   3   . naproxen (NAPROSYN) 500 MG tablet   Oral   Take 1 tablet (500 mg total) by mouth 2 (two) times daily with a meal.   60 tablet   11   . oxyCODONE (OXY IR/ROXICODONE) 5 MG immediate release tablet      Ad lib.          Triage Vitals: BP 132/90  Pulse 84  Temp(Src) 98.7 F (37.1 C) (Oral)  Resp 18  Wt 233 lb (105.688 kg)  SpO2 98%  Physical Exam  Nursing note and vitals  reviewed. Constitutional: He is oriented to person, place, and time. He appears well-developed and well-nourished.  HENT:  Head: Normocephalic and atraumatic.  Right Ear: Tympanic membrane normal. Tympanic membrane is not erythematous and not bulging.  Left Ear: Tympanic membrane normal. Tympanic membrane is not erythematous and not bulging.  Mouth/Throat: Uvula is midline. No oropharyngeal exudate or posterior oropharyngeal erythema.  No sinus tenderness, oropharynx is erythematous, no tonsillar exudates, no signs of tonsillar or peritonsillar abscesses, uvula is midline, airway is intact  Eyes: EOM are normal.  Neck: Normal range of motion.  Cardiovascular: Normal rate and regular rhythm.  Exam reveals no gallop and no friction rub.   No murmur heard. Pulmonary/Chest: Effort normal and breath sounds normal. No respiratory distress. He has no wheezes. He has no rales. He exhibits no tenderness.  Abdominal:  Soft. Bowel sounds are normal. He exhibits no distension and no mass. There is no tenderness. There is no rebound and no guarding.  Musculoskeletal: Normal range of motion.  Lymphadenopathy:    He has no cervical adenopathy.  Neurological: He is alert and oriented to person, place, and time.  Skin: Skin is warm and dry.  Psychiatric: He has a normal mood and affect. His behavior is normal.    ED Course  Procedures (including critical care time)  DIAGNOSTIC STUDIES: Oxygen Saturation is 98% on RA, Normal by my interpretation.    COORDINATION OF CARE: 11:25 PM- Will order chest X-Ray. Discussed treatment plan with pt at bedside and pt agreed to plan.     Labs Review Labs Reviewed - No data to display Imaging Review No results found.  EKG Interpretation   None       MDM   1. URI (upper respiratory infection)    Pt CXR negative for acute infiltrate. Patients symptoms are consistent with URI, likely viral etiology. Discussed that antibiotics are not indicated for viral infections. Pt will be discharged with symptomatic treatment.  Verbalizes understanding and is agreeable with plan. Pt is hemodynamically stable & in NAD prior to dc.    I personally performed the services described in this documentation, which was scribed in my presence. The recorded information has been reviewed and is accurate.    Roxy Horseman, PA-C 06/07/13 0005  Roxy Horseman, PA-C 06/07/13 1610

## 2013-06-09 NOTE — ED Provider Notes (Signed)
Medical screening examination/treatment/procedure(s) were performed by non-physician practitioner and as supervising physician I was immediately available for consultation/collaboration.  EKG Interpretation   None         Keniesha Adderly J Koren Sermersheim, MD 06/09/13 0745 

## 2013-08-20 ENCOUNTER — Ambulatory Visit (INDEPENDENT_AMBULATORY_CARE_PROVIDER_SITE_OTHER): Payer: Federal, State, Local not specified - PPO | Admitting: Internal Medicine

## 2013-08-20 VITALS — BP 142/90 | HR 68 | Temp 98.0°F | Resp 18 | Ht 73.25 in | Wt 229.0 lb

## 2013-08-20 DIAGNOSIS — G4730 Sleep apnea, unspecified: Secondary | ICD-10-CM

## 2013-08-20 DIAGNOSIS — K219 Gastro-esophageal reflux disease without esophagitis: Secondary | ICD-10-CM

## 2013-08-20 DIAGNOSIS — I10 Essential (primary) hypertension: Secondary | ICD-10-CM

## 2013-08-20 DIAGNOSIS — Z0000 Encounter for general adult medical examination without abnormal findings: Secondary | ICD-10-CM

## 2013-08-20 NOTE — Progress Notes (Signed)
Subjective:    Patient ID: Andre Stein, male    DOB: Jun 21, 1966, 48 y.o.   MRN: [id number] This chart was scribed for Ellamae Siaobert Doolittle, MD by Valera CastleSteven Perry, ED Scribe. This patient was seen in room 11 and the patient's care was started at 5:21 PM.  Chief Complaint  Patient presents with   Annual Exam    HPI Andre Samplesllison K Slimp is a 48 y.o. male Pt presents for annual exam.   He reports he is on BP medication and Nexium. He is followed by Sanda Lingerhomas Jones, MD. He reports his last check up with him was 1 year ago. He states Dr. Yetta BarreJones is keeping a check on his prostate. He denies h/o smoking, EtOH use. He denies regular exercise. He reports being otherwise healthy.   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 program  PCP - Sanda Lingerhomas Jones, MD  Patient Active Problem List   Diagnosis Date Noted   Pure hyperglyceridemia 10/30/2012   Routine general medical examination at a health care facility 08/12/2011   SLEEP APNEA 07/02/2010   CERVICAL RADICULOPATHY 02/09/2010   BACK PAIN, LUMBAR 02/09/2010   HYPERTENSION 12/06/2009   GERD 12/06/2009   Family History  Problem Relation Age of Onset   Arthritis Other    Diabetes Other    Hypertension Other    Stroke Other    Heart disease Father    Cancer Mother     pt believes it was colon    Prior to Admission medications   Medication Sig Start Date End Date Taking? Authorizing Provider  esomeprazole (NEXIUM) 40 MG capsule Take 1 capsule (40 mg total) by mouth daily. 10/30/12  Yes Etta Grandchildhomas L Jones, MD  metoprolol succinate (TOPROL-XL) 25 MG 24 hr tablet Take 1 tablet (25 mg total) by mouth daily. 10/30/12  Yes Etta Grandchildhomas L Jones, MD  NONFORMULARY OR COMPOUNDED ITEM Apply 1 application topically daily as needed (topical cream for skin dryness/irritation from dermatolgist).   Yes Historical Provider, MD  OVER THE COUNTER MEDICATION Place 3 drops into both ears daily as needed ("for sinus and ear infection  medication").   Yes Historical Provider, MD  Phenylephrine-DM-GG (MUCINEX FAST-MAX CONGEST COUGH) 2.5-5-100 MG/5ML LIQD Take 30 mLs by mouth every 4 (four) hours as needed (for cold).    Historical Provider, MD   Review of Systems  All other systems reviewed and are negative.      Objective:   Physical Exam  Nursing note and vitals reviewed. Constitutional: He is oriented to person, place, and time. He appears well-developed and well-nourished. No distress.  HENT:  Head: Normocephalic and atraumatic.  Left Ear: External ear normal.  Nose: Nose normal.  Mouth/Throat: Oropharynx is clear and moist. No oropharyngeal exudate.  Eyes: Conjunctivae and EOM are normal. Pupils are equal, round, and reactive to light.  Neck: Neck supple. No thyromegaly present.  Cardiovascular: Normal rate, regular rhythm and normal heart sounds.   No murmur heard. Pulmonary/Chest: Effort normal and breath sounds normal. No respiratory distress. He has no wheezes. He has no rales.  Abdominal: Soft. There is no hepatosplenomegaly, splenomegaly or hepatomegaly. There is no tenderness.  Musculoskeletal: Normal range of motion.  Lymphadenopathy:    He has no cervical adenopathy.  Neurological: He is alert and oriented to person, place, and time.  Skin: Skin is warm and dry.  Psychiatric: He has a normal mood and affect. His behavior is normal.    BP 142/90   Pulse 68  Temp(Src) 98 F (36.7 C) (Oral)   Resp 18   Ht 6' 1.25" (1.861 m)   Wt 229 lb (103.874 kg)   BMI 29.99 kg/m2   SpO2 97%     Assessment & Plan:   Routine general medical examination at a health care facility  HYPERTENSION  GERD  SLEEP APNEA  No orders of the defined types were placed in this encounter.   no refills were needed currently He will follow with his PCP  I have completed the patient encounter in its entirety as documented by the scribe, with editing by me where necessary. Robert P. Merla Riches, M.D.

## 2013-12-02 ENCOUNTER — Other Ambulatory Visit: Payer: Self-pay | Admitting: Internal Medicine

## 2013-12-27 ENCOUNTER — Encounter: Payer: Self-pay | Admitting: Internal Medicine

## 2013-12-27 ENCOUNTER — Other Ambulatory Visit (INDEPENDENT_AMBULATORY_CARE_PROVIDER_SITE_OTHER): Payer: Federal, State, Local not specified - PPO

## 2013-12-27 ENCOUNTER — Ambulatory Visit (INDEPENDENT_AMBULATORY_CARE_PROVIDER_SITE_OTHER): Payer: Federal, State, Local not specified - PPO | Admitting: Internal Medicine

## 2013-12-27 VITALS — BP 136/88 | HR 71 | Temp 98.0°F | Resp 16 | Ht 71.0 in | Wt 232.0 lb

## 2013-12-27 DIAGNOSIS — M5412 Radiculopathy, cervical region: Secondary | ICD-10-CM

## 2013-12-27 DIAGNOSIS — Z0000 Encounter for general adult medical examination without abnormal findings: Secondary | ICD-10-CM

## 2013-12-27 DIAGNOSIS — M545 Low back pain, unspecified: Secondary | ICD-10-CM

## 2013-12-27 DIAGNOSIS — L301 Dyshidrosis [pompholyx]: Secondary | ICD-10-CM | POA: Insufficient documentation

## 2013-12-27 DIAGNOSIS — K219 Gastro-esophageal reflux disease without esophagitis: Secondary | ICD-10-CM

## 2013-12-27 DIAGNOSIS — G4730 Sleep apnea, unspecified: Secondary | ICD-10-CM

## 2013-12-27 DIAGNOSIS — I10 Essential (primary) hypertension: Secondary | ICD-10-CM

## 2013-12-27 LAB — COMPREHENSIVE METABOLIC PANEL
ALBUMIN: 4.3 g/dL (ref 3.5–5.2)
ALK PHOS: 66 U/L (ref 39–117)
ALT: 28 U/L (ref 0–53)
AST: 21 U/L (ref 0–37)
BUN: 12 mg/dL (ref 6–23)
CO2: 29 mEq/L (ref 19–32)
Calcium: 9.5 mg/dL (ref 8.4–10.5)
Chloride: 106 mEq/L (ref 96–112)
Creatinine, Ser: 1.3 mg/dL (ref 0.4–1.5)
GFR: 78.08 mL/min (ref >60.00)
Glucose, Bld: 109 mg/dL — ABNORMAL HIGH (ref 70–99)
POTASSIUM: 4.1 meq/L (ref 3.5–5.1)
SODIUM: 141 meq/L (ref 135–145)
TOTAL PROTEIN: 7.6 g/dL (ref 6.0–8.3)
Total Bilirubin: 0.8 mg/dL (ref 0.2–1.2)

## 2013-12-27 LAB — CBC WITH DIFFERENTIAL/PLATELET
Basophils Absolute: 0 10*3/uL (ref 0.0–0.1)
Basophils Relative: 0.3 % (ref 0.0–3.0)
EOS PCT: 4.2 % (ref 0.0–5.0)
Eosinophils Absolute: 0.2 10*3/uL (ref 0.0–0.7)
HCT: 41.5 % (ref 39.0–52.0)
Hemoglobin: 14.1 g/dL (ref 13.0–17.0)
LYMPHS ABS: 1.2 10*3/uL (ref 0.7–4.0)
Lymphocytes Relative: 25 % (ref 12.0–46.0)
MCHC: 33.9 g/dL (ref 30.0–36.0)
MCV: 88.5 fl (ref 78.0–100.0)
MONO ABS: 0.4 10*3/uL (ref 0.1–1.0)
Monocytes Relative: 8.3 % (ref 3.0–12.0)
NEUTROS PCT: 62.2 % (ref 43.0–77.0)
Neutro Abs: 2.9 10*3/uL (ref 1.4–7.7)
PLATELETS: 182 10*3/uL (ref 150.0–400.0)
RBC: 4.69 Mil/uL (ref 4.22–5.81)
RDW: 13.9 % (ref 11.5–15.5)
WBC: 4.6 10*3/uL (ref 4.0–10.5)

## 2013-12-27 LAB — LIPID PANEL
CHOLESTEROL: 235 mg/dL — AB (ref 0–200)
HDL: 36.6 mg/dL — ABNORMAL LOW (ref >39.00)
NonHDL: 198.4
TRIGLYCERIDES: 489 mg/dL — AB (ref 0.0–149.0)
Total CHOL/HDL Ratio: 6
VLDL: 97.8 mg/dL — ABNORMAL HIGH (ref 0.0–40.0)

## 2013-12-27 LAB — TSH: TSH: 1.63 u[IU]/mL (ref 0.35–4.50)

## 2013-12-27 LAB — FECAL OCCULT BLOOD, GUAIAC: FECAL OCCULT BLD: NEGATIVE

## 2013-12-27 LAB — PSA: PSA: 1.78 ng/mL (ref 0.10–4.00)

## 2013-12-27 MED ORDER — METOPROLOL SUCCINATE ER 25 MG PO TB24: 25.0000 mg | ORAL_TABLET | Freq: Every day | ORAL | Status: DC

## 2013-12-27 MED ORDER — ESOMEPRAZOLE MAGNESIUM 40 MG PO CPDR: DELAYED_RELEASE_CAPSULE | ORAL | Status: DC

## 2013-12-27 NOTE — Assessment & Plan Note (Signed)
I have asked him to have an MRI done to see what the pathology is in his lower back that could be causing these symptoms

## 2013-12-27 NOTE — Progress Notes (Signed)
Subjective:    Patient ID: Andre Stein, male    DOB: 1965-12-09, 48 y.o.   MRN: [id number]  Back Pain This is a chronic problem. The current episode started more than 1 year ago. The problem has been gradually worsening since onset. The pain is present in the lumbar spine. The quality of the pain is described as aching. The pain radiates to the left thigh. The pain is at a severity of 2/10. The pain is mild. The pain is worse during the day. The symptoms are aggravated by bending and position. Stiffness is present all day. Associated symptoms include leg pain (left), numbness (left foot), paresthesias and weakness (left leg weakness). Pertinent negatives include no abdominal pain, bladder incontinence, bowel incontinence, chest pain, dysuria, fever, headaches, paresis, pelvic pain, perianal numbness, tingling or weight loss. Risk factors include obesity. He has tried NSAIDs for the symptoms. The treatment provided mild relief.      Review of Systems  Constitutional: Negative for fever, chills, weight loss, diaphoresis, appetite change and fatigue.  HENT: Negative.   Eyes: Negative.   Respiratory: Positive for apnea. Negative for cough, choking, chest tightness, shortness of breath, wheezing and stridor.   Cardiovascular: Negative.  Negative for chest pain, palpitations and leg swelling.  Gastrointestinal: Negative.  Negative for nausea, vomiting, abdominal pain, diarrhea, constipation, blood in stool and bowel incontinence.  Endocrine: Negative.   Genitourinary: Negative.  Negative for bladder incontinence, dysuria and pelvic pain.  Musculoskeletal: Positive for back pain. Negative for arthralgias, gait problem, joint swelling, myalgias, neck pain and neck stiffness.  Skin: Positive for rash. Negative for color change, pallor and wound.       Dry/itchy skin around torso and neck, has been treated by derm.  Allergic/Immunologic: Negative.   Neurological: Positive for weakness (left leg  weakness), numbness (left foot) and paresthesias. Negative for dizziness, tingling, light-headedness and headaches.  Hematological: Negative.  Negative for adenopathy. Does not bruise/bleed easily.  Psychiatric/Behavioral: Negative.        Objective:   Physical Exam  Vitals reviewed. Constitutional: He is oriented to person, place, and time. He appears well-developed and well-nourished. No distress.  HENT:  Head: Normocephalic and atraumatic.  Mouth/Throat: Oropharynx is clear and moist. No oropharyngeal exudate.  Eyes: Conjunctivae are normal. Right eye exhibits no discharge. Left eye exhibits no discharge. No scleral icterus.  Neck: Normal range of motion. Neck supple. No JVD present. No tracheal deviation present. No thyromegaly present.  Cardiovascular: Normal rate, regular rhythm, normal heart sounds and intact distal pulses.  Exam reveals no gallop and no friction rub.   No murmur heard. Pulmonary/Chest: Effort normal and breath sounds normal. No stridor. No respiratory distress. He has no wheezes. He has no rales. He exhibits no tenderness.  Abdominal: Soft. Bowel sounds are normal. He exhibits no distension and no mass. There is no tenderness. There is no rebound and no guarding. Hernia confirmed negative in the right inguinal area and confirmed negative in the left inguinal area.  Genitourinary: Rectum normal, testes normal and penis normal. Rectal exam shows no external hemorrhoid, no internal hemorrhoid, no fissure, no mass, no tenderness and anal tone normal. Guaiac negative stool. Prostate is not enlarged and not tender. Right testis shows no mass, no swelling and no tenderness. Right testis is descended. Left testis shows no mass, no swelling and no tenderness. Left testis is descended. Circumcised. No penile erythema or penile tenderness. No discharge found.  Musculoskeletal: Normal range of motion. He exhibits no edema  and no tenderness.       Lumbar back: Normal. He exhibits  normal range of motion, no tenderness, no bony tenderness, no swelling, no edema, no deformity, no laceration, no pain, no spasm and normal pulse.  Lymphadenopathy:    He has no cervical adenopathy.       Right: No inguinal adenopathy present.       Left: No inguinal adenopathy present.  Neurological: He is alert and oriented to person, place, and time. He has normal strength. He displays no atrophy, no tremor and normal reflexes. No cranial nerve deficit or sensory deficit. He exhibits normal muscle tone. He displays a negative Romberg sign. He displays no seizure activity. Coordination and gait normal.  Reflex Scores:      Tricep reflexes are 1+ on the right side and 1+ on the left side.      Bicep reflexes are 1+ on the right side and 1+ on the left side.      Brachioradialis reflexes are 1+ on the right side and 1+ on the left side.      Patellar reflexes are 1+ on the right side and 1+ on the left side.      Achilles reflexes are 1+ on the right side and 1+ on the left side. Skin: Skin is warm and dry. Rash noted. No purpura noted. Rash is not macular, not papular, not maculopapular, not nodular, not pustular, not vesicular and not urticarial. He is not diaphoretic. No erythema. No pallor.  There is severe xerosis around his neck and upper torso.  Psychiatric: He has a normal mood and affect. His behavior is normal. Judgment and thought content normal.     Lab Results  Component Value Date   WBC 4.7 10/30/2012   HGB 13.9 10/30/2012   HCT 41.6 10/30/2012   PLT 178.0 10/30/2012   GLUCOSE 92 10/30/2012   CHOL 162 10/30/2012   TRIG 177.0* 10/30/2012   HDL 31.70* 10/30/2012   LDLDIRECT 56.3 08/12/2011   LDLCALC 95 10/30/2012   ALT 28 10/30/2012   AST 22 10/30/2012   NA 140 10/30/2012   K 4.9 10/30/2012   CL 106 10/30/2012   CREATININE 1.3 10/30/2012   BUN 14 10/30/2012   CO2 33* 10/30/2012   TSH 2.33 10/30/2012   PSA 1.59 10/30/2012       Assessment & Plan:

## 2013-12-27 NOTE — Assessment & Plan Note (Signed)
Exam done Vaccines were reviewed Labs ordered Pt ed material was given 

## 2013-12-27 NOTE — Assessment & Plan Note (Signed)
His BP is well controlled 

## 2013-12-27 NOTE — Progress Notes (Signed)
Pre visit review using our clinic review tool, if applicable. No additional management support is needed unless otherwise documented below in the visit note. 

## 2013-12-27 NOTE — Patient Instructions (Signed)
Health Maintenance, Males A healthy lifestyle and preventative care can promote health and wellness.  Maintain regular health, dental, and eye exams.  Eat a healthy diet. Foods like vegetables, fruits, whole grains, low-fat dairy products, and lean protein foods contain the nutrients you need and are low in calories. Decrease your intake of foods high in solid fats, added sugars, and salt. Get information about a proper diet from your health care provider, if necessary.  Regular physical exercise is one of the most important things you can do for your health. Most adults should get at least 150 minutes of moderate-intensity exercise (any activity that increases your heart rate and causes you to sweat) each week. In addition, most adults need muscle-strengthening exercises on 2 or more days a week.   Maintain a healthy weight. The body mass index (BMI) is a screening tool to identify possible weight problems. It provides an estimate of body fat based on height and weight. Your health care provider can find your BMI and can help you achieve or maintain a healthy weight. For males 20 years and older:  A BMI below 18.5 is considered underweight.  A BMI of 18.5 to 24.9 is normal.  A BMI of 25 to 29.9 is considered overweight.  A BMI of 30 and above is considered obese.  Maintain normal blood lipids and cholesterol by exercising and minimizing your intake of saturated fat. Eat a balanced diet with plenty of fruits and vegetables. Blood tests for lipids and cholesterol should begin at age 20 and be repeated every 5 years. If your lipid or cholesterol levels are high, you are over 50, or you are at high risk for heart disease, you may need your cholesterol levels checked more frequently.Ongoing high lipid and cholesterol levels should be treated with medicines, if diet and exercise are not working.  If you smoke, find out from your health care provider how to quit. If you do not use tobacco, do not  start.  Lung cancer screening is recommended for adults aged 55 80 years who are at high risk for developing lung cancer because of a history of smoking. A yearly low-dose CT scan of the lungs is recommended for people who have at least a 30-pack-year history of smoking and are a current smoker or have quit within the past 15 years. A pack year of smoking is smoking an average of 1 pack of cigarettes a day for 1 year (for example, a 30-pack-year history of smoking could mean smoking 1 pack a day for 30 years or 2 packs a day for 15 years). Yearly screening should continue until the smoker has stopped smoking for at least 15 years. Yearly screening should be stopped for people who develop a health problem that would prevent them from having lung cancer treatment.  If you choose to drink alcohol, do not have more than 2 drinks per day. One drink is considered to be 12 oz (360 mL) of beer, 5 oz (150 mL) of wine, or 1.5 oz (45 mL) of liquor.  Avoid use of street drugs. Do not share needles with anyone. Ask for help if you need support or instructions about stopping the use of drugs.  High blood pressure causes heart disease and increases the risk of stroke. Blood pressure should be checked at least every 1 2 years. Ongoing high blood pressure should be treated with medicines if weight loss and exercise are not effective.  If you are 45 50 years old, ask your health   care provider if you should take aspirin to prevent heart disease.  Diabetes screening involves taking a blood sample to check your fasting blood sugar level. This should be done once every 3 years after age 45, if you are at a normal weight and without risk factors for diabetes. Testing should be considered at a younger age or be carried out more frequently if you are overweight and have at least 1 risk factor for diabetes.  Colorectal cancer can be detected and often prevented. Most routine colorectal cancer screening begins at the age of 50  and continues through age 75. However, your health care provider may recommend screening at an earlier age if you have risk factors for colon cancer. On a yearly basis, your health care provider may provide home test kits to check for hidden blood in the stool. A small camera at the end of a tube may be used to directly examine the colon (sigmoidoscopy or colonoscopy) to detect the earliest forms of colorectal cancer. Talk to your health care provider about this at age 50, when routine screening begins. A direct exam of the colon should be repeated every 5 10 years through age 75, unless early forms of pre-cancerous polyps or small growths are found.  People who are at an increased risk for hepatitis B should be screened for this virus. You are considered at high risk for hepatitis B if:  You were born in a country where hepatitis B occurs often. Talk with your health care provider about which countries are considered high-risk.  Your parents were born in a high-risk country and you have not received a shot to protect against hepatitis B (hepatitis B vaccine).  You have HIV or AIDS.  You use needles to inject street drugs.  You live with, or have sex with, someone who has hepatitis B.  You are a man who has sex with other men (MSM).  You get hemodialysis treatment.  You take certain medicines for conditions like cancer, organ transplantation, and autoimmune conditions.  Hepatitis C blood testing is recommended for all people born from 1945 through 1965 and any individual with known risk factors for hepatitis C.  Healthy men should no longer receive prostate-specific antigen (PSA) blood tests as part of routine cancer screening. Talk to your health care provider about prostate cancer screening.  Testicular cancer screening is not recommended for adolescents or adult males who have no symptoms. Screening includes self-exam, a health care provider exam, and other screening tests. Consult with  your health care provider about any symptoms you have or any concerns you have about testicular cancer.  Practice safe sex. Use condoms and avoid high-risk sexual practices to reduce the spread of sexually transmitted infections (STIs).  Use sunscreen. Apply sunscreen liberally and repeatedly throughout the day. You should seek shade when your shadow is shorter than you. Protect yourself by wearing long sleeves, pants, a wide-brimmed hat, and sunglasses year round, whenever you are outdoors.  Tell your health care provider of new moles or changes in moles, especially if there is a change in shape or color. Also tell your provider if a mole is larger than the size of a pencil eraser.  A one-time screening for abdominal aortic aneurysm (AAA) and surgical repair of large AAAs by ultrasound is recommended for men aged 65 75 years who are current or former smokers.  Stay current with your vaccines (immunizations). Document Released: 12/28/2007 Document Revised: 04/21/2013 Document Reviewed: 11/26/2010 [employer] Patient Information 2014 [employer].   Back Pain, Adult Low back pain is very common. About 1 in 5 people have back pain.The cause of low back pain is rarely dangerous. The pain often gets better over time.About half of people with a sudden onset of back pain feel better in just 2 weeks. About 8 in 10 people feel better by 6 weeks.  CAUSES Some common causes of back pain include:  Strain of the muscles or ligaments supporting the spine.  Wear and tear (degeneration) of the spinal discs.  Arthritis.  Direct injury to the back. DIAGNOSIS Most of the time, the direct cause of low back pain is not known.However, back pain can be treated effectively even when the exact cause of the pain is unknown.Answering your caregiver's questions about your overall health and symptoms is one of the most accurate ways to make sure the cause of your pain is not dangerous. If your caregiver needs more  information, he or she may order lab work or imaging tests (X-rays or MRIs).However, even if imaging tests show changes in your back, this usually does not require surgery. HOME CARE INSTRUCTIONS For many people, back pain returns.Since low back pain is rarely dangerous, it is often a condition that people can learn to manageon their own.   Remain active. It is stressful on the back to sit or stand in one place. Do not sit, drive, or stand in one place for more than 30 minutes at a time. Take short walks on level surfaces as soon as pain allows.Try to increase the length of time you walk each day.  Do not stay in bed.Resting more than 1 or 2 days can delay your recovery.  Do not avoid exercise or work.Your body is made to move.It is not dangerous to be active, even though your back may hurt.Your back will likely heal faster if you return to being active before your pain is gone.  Pay attention to your body when you bend and lift. Many people have less discomfortwhen lifting if they bend their knees, keep the load close to their bodies,and avoid twisting. Often, the most comfortable positions are those that put less stress on your recovering back.  Find a comfortable position to sleep. Use a firm mattress and lie on your side with your knees slightly bent. If you lie on your back, put a pillow under your knees.  Only take over-the-counter or prescription medicines as directed by your caregiver. Over-the-counter medicines to reduce pain and inflammation are often the most helpful.Your caregiver may prescribe muscle relaxant drugs.These medicines help dull your pain so you can more quickly return to your normal activities and healthy exercise.  Put ice on the injured area.  Put ice in a plastic bag.  Place a towel between your skin and the bag.  Leave the ice on for 15-20 minutes, 03-04 times a day for the first 2 to 3 days. After that, ice and heat may be alternated to reduce pain  and spasms.  Ask your caregiver about trying back exercises and gentle massage. This may be of some benefit.  Avoid feeling anxious or stressed.Stress increases muscle tension and can worsen back pain.It is important to recognize when you are anxious or stressed and learn ways to manage it.Exercise is a great option. SEEK MEDICAL CARE IF:  You have pain that is not relieved with rest or medicine.  You have pain that does not improve in 1 week.  You have new symptoms.  You are generally not feeling well.   SEEK IMMEDIATE MEDICAL CARE IF:   You have pain that radiates from your back into your legs.  You develop new bowel or bladder control problems.  You have unusual weakness or numbness in your arms or legs.  You develop nausea or vomiting.  You develop abdominal pain.  You feel faint. Document Released: 07/01/2005 Document Revised: 12/31/2011 Document Reviewed: 11/19/2010 [employer] Patient Information 2014 [employer].  

## 2014-01-01 ENCOUNTER — Encounter: Payer: Self-pay | Admitting: Internal Medicine

## 2014-01-01 ENCOUNTER — Ambulatory Visit
Admission: RE | Admit: 2014-01-01 | Discharge: 2014-01-01 | Disposition: A | Discharge status: Home / Self Care | Payer: Federal, State, Local not specified - PPO | Source: Ambulatory Visit | Attending: Internal Medicine | Admitting: Internal Medicine

## 2014-01-01 DIAGNOSIS — M545 Low back pain, unspecified: Secondary | ICD-10-CM

## 2014-09-17 ENCOUNTER — Encounter (HOSPITAL_COMMUNITY): Payer: Self-pay | Admitting: *Deleted

## 2014-09-17 ENCOUNTER — Emergency Department (HOSPITAL_COMMUNITY)
Admission: EM | Admit: 2014-09-17 | Discharge: 2014-09-17 | Disposition: A | Discharge status: Home / Self Care | Payer: Federal, State, Local not specified - PPO | Attending: Emergency Medicine | Admitting: Emergency Medicine

## 2014-09-17 DIAGNOSIS — Z79899 Other long term (current) drug therapy: Secondary | ICD-10-CM | POA: Diagnosis not present

## 2014-09-17 DIAGNOSIS — H6501 Acute serous otitis media, right ear: Secondary | ICD-10-CM

## 2014-09-17 DIAGNOSIS — H9201 Otalgia, right ear: Secondary | ICD-10-CM

## 2014-09-17 DIAGNOSIS — I10 Essential (primary) hypertension: Secondary | ICD-10-CM | POA: Insufficient documentation

## 2014-09-17 DIAGNOSIS — K219 Gastro-esophageal reflux disease without esophagitis: Secondary | ICD-10-CM | POA: Diagnosis not present

## 2014-09-17 DIAGNOSIS — R0981 Nasal congestion: Secondary | ICD-10-CM | POA: Diagnosis not present

## 2014-09-17 MED ORDER — NAPROXEN 500 MG PO TABS: 500.0000 mg | ORAL_TABLET | Freq: Two times a day (BID) | ORAL | Status: DC

## 2014-09-17 MED ORDER — MOMETASONE FUROATE 50 MCG/ACT NA SUSP: 2.0000 | Freq: Every day | NASAL | Status: DC

## 2014-09-17 MED ORDER — AMOXICILLIN 500 MG PO CAPS: 500.0000 mg | ORAL_CAPSULE | Freq: Two times a day (BID) | ORAL | Status: DC

## 2014-09-17 NOTE — ED Notes (Signed)
Painful rt ear and hard od hearing for one month

## 2014-09-17 NOTE — ED Provider Notes (Signed)
CSN: [id number]     Arrival date & time 09/17/14  1916 History  This chart is scribed for non-physician practitioner, Andre Finner, PA-C, working with Gilda Crease, MD by Andre Stein, ED Scribe.  This patient was seen in room TR04C/TR04C and the patient's care was started 8:12 PM.      Chief Complaint  Patient presents with  . Otalgia     Patient is a 49 y.o. male presenting with ear pain. The history is provided by the patient. No language interpreter was used.  Otalgia Associated symptoms: congestion and tinnitus   Associated symptoms: no cough    HPI Comments: Andre Stein is a 49 y.o. male who presents to the Emergency Department complaining of right ear pain for a month worsening 2 weeks ago.  Pt states he had a cold at onset which has resolved. Pt notes associated congestion, tinnitus, drainage, and numbness to right side of face. Right ear pain is aching and sore, 7/10. Pt has take Motrin and used "sweet oil" for relief. Pt with history of allergies and currently take Claritin. Pt denies cough or SOB. Pt's PCP is Dr. Yetta Barre. Denies fever, n/v/d.  Past Medical History  Diagnosis Date  . Hypertension   . GERD (gastroesophageal reflux disease)   . Bilateral inguinal hernia   . Chest pain   . Abdominal pain   . Umbilical hernia     ventral   Past Surgical History  Procedure Laterality Date  . Umbilical hernia repair  10/29/11  . Hernia repair  10/29/11    BIH and lysis of adhesions  . Inguinal hernia repair      left    Family History  Problem Relation Age of Onset  . Arthritis Other   . Diabetes Other   . Hypertension Other   . Stroke Other   . Heart disease Father   . Cancer Mother     pt believes it was colon   History  Substance Use Topics  . Smoking status: Never Smoker   . Smokeless tobacco: Never Used  . Alcohol Use: No    Review of Systems  HENT: Positive for congestion, ear pain and tinnitus.   Respiratory: Negative for cough and  shortness of breath.   All other systems reviewed and are negative.     Allergies  Review of patient's allergies indicates no known allergies.  Home Medications   Prior to Admission medications   Medication Sig Start Date End Date Taking? Authorizing Provider  amoxicillin (AMOXIL) 500 MG capsule Take 1 capsule (500 mg total) by mouth 2 (two) times daily. 09/17/14   Andre Finner, PA-C  clindamycin (CLEOCIN T) 1 % external solution  10/28/13   Historical Provider, MD  esomeprazole (NEXIUM) 40 MG capsule take 1 capsule by mouth once daily 12/27/13   Andre Grandchild, MD  metoprolol succinate (TOPROL-XL) 25 MG 24 hr tablet Take 1 tablet (25 mg total) by mouth daily. 12/27/13   Andre Grandchild, MD  mometasone (NASONEX) 50 MCG/ACT nasal spray Place 2 sprays into the nose daily. 09/17/14   Andre Finner, PA-C  naproxen (NAPROSYN) 500 MG tablet Take 1 tablet (500 mg total) by mouth 2 (two) times daily. 09/17/14   Andre Finner, PA-C  NONFORMULARY OR COMPOUNDED ITEM Apply 1 application topically daily as needed (topical cream for skin dryness/irritation from dermatolgist).    Historical Provider, MD  OVER THE COUNTER MEDICATION Place 3 drops into both ears daily as needed ("for sinus and ear  infection medication").    Historical Provider, MD  Phenylephrine-DM-GG (MUCINEX FAST-MAX CONGEST COUGH) 2.5-5-100 MG/5ML LIQD Take 30 mLs by mouth every 4 (four) hours as needed (for cold).    Historical Provider, MD   BP 125/89 mmHg  Pulse 82  Temp(Src) 98.6 F (37 C) (Oral)  Resp 18  SpO2 97% Physical Exam  Constitutional: He is oriented to person, place, and time. He appears well-developed and well-nourished.  HENT:  Head: Normocephalic and atraumatic.  Right Ear: Tympanic membrane is injected. A middle ear effusion (worse on this side.) is present.  Left Ear: Tympanic membrane is injected. A middle ear effusion is present.  Nose: Mucosal edema present.  Eyes: EOM are normal.  Neck: Normal range of motion.   Cardiovascular: Normal rate.   Pulmonary/Chest: Effort normal.  Musculoskeletal: Normal range of motion.  Neurological: He is alert and oriented to person, place, and time.  Skin: Skin is warm and dry.  Psychiatric: He has a normal mood and affect. His behavior is normal.  Nursing note and vitals reviewed.   ED Course  Procedures (including critical care time) DIAGNOSTIC STUDIES: Oxygen Saturation is 97% on room air, normal by my interpretation.    COORDINATION OF CARE: 8:16 PM Discussed treatment plan with patient at beside, the patient agrees with the plan and has no further questions at this time.   Labs Review Labs Reviewed - No data to display  Imaging Review No results found.   EKG Interpretation None      MDM   Final diagnoses:  Otalgia, right  Right acute serous otitis media, recurrence not specified  Nasal congestion   Will tx pt for serous otitis media, will tx with amoxicillin. Home care instructions provided. Advised to discontinue use of sweet oil. Advised to f/u with PCP within 1 week.  Return precautions provided. Pt verbalized understanding and agreement with tx plan.   I personally performed the services described in this documentation, which was scribed in my presence. The recorded information has been reviewed and is accurate.    Andre Finnerrin O'Malley, PA-C 09/17/14 [id number]  Gilda Creasehristopher J. Pollina, MD 09/17/14 [phone]

## 2014-09-17 NOTE — ED Notes (Signed)
Pt states that he has been using q tips and ear drops in his ears, and "sweet oil" a couple of drops in each ear. Pt states that he has only used the drops 3 times over 3 weeks. Pt states that starting last week he feels like the area directly below his ear is numb. No neuro deficits noted in pt.

## 2014-09-26 ENCOUNTER — Encounter: Payer: Self-pay | Admitting: Internal Medicine

## 2014-09-26 ENCOUNTER — Ambulatory Visit (INDEPENDENT_AMBULATORY_CARE_PROVIDER_SITE_OTHER): Payer: Federal, State, Local not specified - PPO | Admitting: Internal Medicine

## 2014-09-26 VITALS — BP 144/102 | HR 57 | Temp 98.6°F | Resp 16 | Ht 71.0 in | Wt 237.0 lb

## 2014-09-26 DIAGNOSIS — I10 Essential (primary) hypertension: Secondary | ICD-10-CM

## 2014-09-26 DIAGNOSIS — J301 Allergic rhinitis due to pollen: Secondary | ICD-10-CM

## 2014-09-26 DIAGNOSIS — H6981 Other specified disorders of Eustachian tube, right ear: Secondary | ICD-10-CM

## 2014-09-26 DIAGNOSIS — H6980 Other specified disorders of Eustachian tube, unspecified ear: Secondary | ICD-10-CM | POA: Insufficient documentation

## 2014-09-26 DIAGNOSIS — H6990 Unspecified Eustachian tube disorder, unspecified ear: Secondary | ICD-10-CM | POA: Insufficient documentation

## 2014-09-26 DIAGNOSIS — J309 Allergic rhinitis, unspecified: Secondary | ICD-10-CM | POA: Insufficient documentation

## 2014-09-26 MED ORDER — AZILSARTAN-CHLORTHALIDONE 40-12.5 MG PO TABS: 1.0000 | ORAL_TABLET | Freq: Every day | ORAL | Status: DC

## 2014-09-26 MED ORDER — METHYLPREDNISOLONE ACETATE 80 MG/ML IJ SUSP
120.0000 mg | Freq: Once | INTRAMUSCULAR | Status: AC
  Administered 2014-09-26: 120 mg via INTRAMUSCULAR

## 2014-09-26 MED ORDER — DESLORATADINE 5 MG PO TABS: 5.0000 mg | ORAL_TABLET | Freq: Every day | ORAL | Status: DC

## 2014-09-26 NOTE — Patient Instructions (Signed)

## 2014-09-26 NOTE — Progress Notes (Signed)
Pre visit review using our clinic review tool, if applicable. No additional management support is needed unless otherwise documented below in the visit note. 

## 2014-09-26 NOTE — Assessment & Plan Note (Signed)
Will avoid decongestants due to the high BP Will treat with an antihistamine and a depo-medrol injection

## 2014-09-26 NOTE — Assessment & Plan Note (Signed)
His BP is not well controlled Will add an ARB/HCTZ combo to the beta-blocker for better BP control

## 2014-09-26 NOTE — Progress Notes (Signed)
Subjective:    Patient ID: Andre Stein, male    DOB: 29-Oct-1965, 49 y.o.   MRN: [id number]  Hypertension This is a chronic problem. The current episode started more than 1 year ago. The problem is uncontrolled. Pertinent negatives include no anxiety, blurred vision, chest pain, headaches, malaise/fatigue, neck pain, orthopnea, palpitations, peripheral edema, PND, shortness of breath or sweats. Past treatments include beta blockers. The current treatment provides moderate improvement. Compliance problems include exercise and diet.   Sinusitis This is a recurrent problem. The current episode started 1 to 4 weeks ago. The problem has been rapidly improving since onset. There has been no fever. The fever has been present for less than 1 day. His pain is at a severity of 0/10. He is experiencing no pain. Associated symptoms include ear pain (rt ear popping, pressure, muffled hearing) and sneezing. Pertinent negatives include no chills, congestion, coughing, diaphoresis, headaches, hoarse voice, neck pain, shortness of breath, sinus pressure, sore throat or swollen glands. Past treatments include nothing. The treatment provided mild relief.      Review of Systems  Constitutional: Negative.  Negative for fever, chills, malaise/fatigue, diaphoresis and fatigue.  HENT: Positive for ear pain (rt ear popping, pressure, muffled hearing), postnasal drip, rhinorrhea and sneezing. Negative for congestion, ear discharge, hoarse voice, sinus pressure, sore throat, tinnitus, trouble swallowing and voice change.   Eyes: Negative.  Negative for blurred vision.  Respiratory: Negative.  Negative for cough and shortness of breath.   Cardiovascular: Negative.  Negative for chest pain, palpitations, orthopnea, leg swelling and PND.  Gastrointestinal: Negative.  Negative for nausea, abdominal pain, diarrhea, constipation and blood in stool.  Endocrine: Negative.   Musculoskeletal: Negative.  Negative for neck  pain.  Skin: Negative.   Allergic/Immunologic: Negative.   Neurological: Negative.  Negative for headaches.  Hematological: Negative.  Negative for adenopathy. Does not bruise/bleed easily.  Psychiatric/Behavioral: Negative.        Objective:   Physical Exam  Constitutional: He is oriented to person, place, and time. He appears well-developed and well-nourished. No distress.  HENT:  Right Ear: Hearing, tympanic membrane, external ear and ear canal normal.  Left Ear: Hearing, tympanic membrane, external ear and ear canal normal.  Nose: Mucosal edema present. No rhinorrhea, nose lacerations, sinus tenderness, nasal deformity, septal deviation or nasal septal hematoma. No epistaxis.  No foreign bodies. Right sinus exhibits no maxillary sinus tenderness and no frontal sinus tenderness. Left sinus exhibits no maxillary sinus tenderness and no frontal sinus tenderness.  Mouth/Throat: Oropharynx is clear and moist and mucous membranes are normal. Mucous membranes are not pale, not dry and not cyanotic. No oral lesions. No trismus in the jaw. No uvula swelling. No oropharyngeal exudate, posterior oropharyngeal edema, posterior oropharyngeal erythema or tonsillar abscesses.  Eyes: Conjunctivae are normal. Right eye exhibits no discharge. Left eye exhibits no discharge. No scleral icterus.  Neck: Normal range of motion. Neck supple. No JVD present. No tracheal deviation present. No thyromegaly present.  Cardiovascular: Normal rate, regular rhythm, normal heart sounds and intact distal pulses.  Exam reveals no gallop and no friction rub.   No murmur heard. Pulmonary/Chest: Effort normal and breath sounds normal. No stridor. No respiratory distress. He has no wheezes. He has no rales. He exhibits no tenderness.  Abdominal: Soft. Bowel sounds are normal. He exhibits no distension and no mass. There is no tenderness. There is no rebound and no guarding.  Musculoskeletal: Normal range of motion. He exhibits  no edema or tenderness.  Lymphadenopathy:    He has no cervical adenopathy.  Neurological: He is oriented to person, place, and time.  Skin: Skin is warm and dry. No rash noted. He is not diaphoretic. No erythema. No pallor.  Nursing note and vitals reviewed.   Lab Results  Component Value Date   WBC 4.6 12/27/2013   HGB 14.1 12/27/2013   HCT 41.5 12/27/2013   PLT 182.0 12/27/2013   GLUCOSE 109* 12/27/2013   CHOL 235* 12/27/2013   TRIG 489.0* 12/27/2013   HDL 36.60* 12/27/2013   LDLDIRECT 56.3 08/12/2011   LDLCALC 95 10/30/2012   ALT 28 12/27/2013   AST 21 12/27/2013   NA 141 12/27/2013   K 4.1 12/27/2013   CL 106 12/27/2013   CREATININE 1.3 12/27/2013   BUN 12 12/27/2013   CO2 29 12/27/2013   TSH 1.63 12/27/2013   PSA 1.78 12/27/2013        Assessment & Plan:

## 2014-09-26 NOTE — Assessment & Plan Note (Signed)
He is having a flare so will add an antihistamine and will give an injection of depo-medrol IM

## 2014-09-27 ENCOUNTER — Telehealth: Payer: Self-pay | Admitting: Internal Medicine

## 2014-09-27 NOTE — Telephone Encounter (Signed)
emmi mailed  °

## 2014-11-24 ENCOUNTER — Emergency Department (HOSPITAL_COMMUNITY)
Admission: EM | Admit: 2014-11-24 | Discharge: 2014-11-24 | Disposition: A | Discharge status: Home / Self Care | Payer: Federal, State, Local not specified - PPO | Attending: Emergency Medicine | Admitting: Emergency Medicine

## 2014-11-24 ENCOUNTER — Encounter (HOSPITAL_COMMUNITY): Payer: Self-pay | Admitting: Emergency Medicine

## 2014-11-24 DIAGNOSIS — K219 Gastro-esophageal reflux disease without esophagitis: Secondary | ICD-10-CM | POA: Insufficient documentation

## 2014-11-24 DIAGNOSIS — R42 Dizziness and giddiness: Secondary | ICD-10-CM | POA: Diagnosis present

## 2014-11-24 DIAGNOSIS — R001 Bradycardia, unspecified: Secondary | ICD-10-CM | POA: Diagnosis not present

## 2014-11-24 DIAGNOSIS — Z791 Long term (current) use of non-steroidal anti-inflammatories (NSAID): Secondary | ICD-10-CM | POA: Insufficient documentation

## 2014-11-24 DIAGNOSIS — R55 Syncope and collapse: Secondary | ICD-10-CM

## 2014-11-24 DIAGNOSIS — Z79899 Other long term (current) drug therapy: Secondary | ICD-10-CM | POA: Diagnosis not present

## 2014-11-24 DIAGNOSIS — I10 Essential (primary) hypertension: Secondary | ICD-10-CM | POA: Diagnosis not present

## 2014-11-24 LAB — CBC WITH DIFFERENTIAL/PLATELET
BASOS ABS: 0 10*3/uL (ref 0.0–0.1)
Basophils Relative: 0 % (ref 0–1)
EOS ABS: 0.2 10*3/uL (ref 0.0–0.7)
Eosinophils Relative: 3 % (ref 0–5)
HCT: 38.9 % — ABNORMAL LOW (ref 39.0–52.0)
Hemoglobin: 12.9 g/dL — ABNORMAL LOW (ref 13.0–17.0)
Lymphocytes Relative: 31 % (ref 12–46)
Lymphs Abs: 1.6 10*3/uL (ref 0.7–4.0)
MCH: 29.5 pg (ref 26.0–34.0)
MCHC: 33.2 g/dL (ref 30.0–36.0)
MCV: 88.8 fL (ref 78.0–100.0)
Monocytes Absolute: 0.3 10*3/uL (ref 0.1–1.0)
Monocytes Relative: 7 % (ref 3–12)
Neutro Abs: 3 10*3/uL (ref 1.7–7.7)
Neutrophils Relative %: 59 % (ref 43–77)
PLATELETS: 185 10*3/uL (ref 150–400)
RBC: 4.38 MIL/uL (ref 4.22–5.81)
RDW: 13.6 % (ref 11.5–15.5)
WBC: 5.1 10*3/uL (ref 4.0–10.5)

## 2014-11-24 LAB — I-STAT CHEM 8, ED
BUN: 15 mg/dL (ref 6–20)
CREATININE: 1.2 mg/dL (ref 0.61–1.24)
Calcium, Ion: 1.24 mmol/L — ABNORMAL HIGH (ref 1.12–1.23)
Chloride: 105 mmol/L (ref 101–111)
Glucose, Bld: 93 mg/dL (ref 65–99)
HEMATOCRIT: 42 % (ref 39.0–52.0)
HEMOGLOBIN: 14.3 g/dL (ref 13.0–17.0)
POTASSIUM: 4.1 mmol/L (ref 3.5–5.1)
Sodium: 142 mmol/L (ref 135–145)
TCO2: 22 mmol/L (ref 0–100)

## 2014-11-24 LAB — I-STAT TROPONIN, ED: TROPONIN I, POC: 0 ng/mL (ref 0.00–0.08)

## 2014-11-24 NOTE — ED Notes (Signed)
Pt st's he works 3rd shift and tonight he started to get out of bed to go to work when he had a syncopal episode while sitting on side of the bed.  Pt st's he fell backwards on to bed.  St's he has felt lightheaded since then with nausea.  Pt alert and oriented x's 3

## 2014-11-24 NOTE — ED Provider Notes (Signed)
CSN: [id number]     Arrival date & time 11/24/14  2021 History   First MD Initiated Contact with Patient 11/24/14 2218     Chief Complaint  Patient presents with  . Dizziness     (Consider location/radiation/quality/duration/timing/severity/associated sxs/prior Treatment) HPI Comments: 49 year old male presents with near-syncope. Reports waking up this evening and air conditioning in house and broken. He reports being very hot in bed. When he sat up he became lightheaded and had a near syncopal event. He laid down. I attempted again and had a near syncopal event. Call his wife placed a little cold pack of his head, drank water which resolved his symptoms. Rising chest pain shortness breath etc.  Patient is a 49 y.o. male presenting with dizziness and near-syncope.  Dizziness Associated symptoms: no chest pain and no shortness of breath   Near Syncope This is a new problem. The current episode started today. The problem occurs rarely. The problem has been resolved. Pertinent negatives include no abdominal pain, chest pain, congestion, fever or rash. Nothing aggravates the symptoms. He has tried nothing for the symptoms.    Past Medical History  Diagnosis Date  . Hypertension   . GERD (gastroesophageal reflux disease)   . Bilateral inguinal hernia   . Chest pain   . Abdominal pain   . Umbilical hernia     ventral   Past Surgical History  Procedure Laterality Date  . Umbilical hernia repair  10/29/11  . Hernia repair  10/29/11    BIH and lysis of adhesions  . Inguinal hernia repair      left    Family History  Problem Relation Age of Onset  . Arthritis Other   . Diabetes Other   . Hypertension Other   . Stroke Other   . Heart disease Father   . Cancer Mother     pt believes it was colon   History  Substance Use Topics  . Smoking status: Never Smoker   . Smokeless tobacco: Never Used  . Alcohol Use: No    Review of Systems  Constitutional: Negative for fever.  HENT:  Negative for congestion.   Respiratory: Negative for shortness of breath.   Cardiovascular: Positive for near-syncope. Negative for chest pain.  Gastrointestinal: Negative for abdominal pain.  Musculoskeletal: Negative for back pain.  Skin: Negative for rash.  Neurological: Positive for dizziness (resolved).  Psychiatric/Behavioral: Negative for confusion.  All other systems reviewed and are negative.     Allergies  Review of patient's allergies indicates no known allergies.  Home Medications   Prior to Admission medications   Medication Sig Start Date End Date Taking? Authorizing Provider  Azilsartan-Chlorthalidone 40-12.5 MG TABS Take 1 tablet by mouth daily. 09/26/14  Yes Etta Grandchildhomas L Jones, MD  desloratadine (CLARINEX) 5 MG tablet Take 1 tablet (5 mg total) by mouth daily. 09/26/14  Yes Etta Grandchildhomas L Jones, MD  esomeprazole (NEXIUM) 40 MG capsule take 1 capsule by mouth once daily 12/27/13  Yes Etta Grandchildhomas L Jones, MD  metoprolol succinate (TOPROL-XL) 25 MG 24 hr tablet Take 1 tablet (25 mg total) by mouth daily. 12/27/13  Yes Etta Grandchildhomas L Jones, MD  mometasone (NASONEX) 50 MCG/ACT nasal spray Place 2 sprays into the nose daily. 09/17/14  Yes Junius FinnerErin O'Malley, PA-C  naproxen (NAPROSYN) 500 MG tablet Take 1 tablet (500 mg total) by mouth 2 (two) times daily. 09/17/14  Yes Junius FinnerErin O'Malley, PA-C  NONFORMULARY OR COMPOUNDED ITEM Apply 1 application topically daily as needed (topical cream for skin  dryness/irritation from dermatolgist).   Yes Historical Provider, MD  OVER THE COUNTER MEDICATION Place 3 drops into both ears daily as needed ("for sinus and ear infection medication").   Yes Historical Provider, MD   BP 119/91 mmHg  Pulse 53  Resp 12  Wt 230 lb 5 oz (104.469 kg)  SpO2 97% Physical Exam  Constitutional: He is oriented to person, place, and time. He appears well-developed.  HENT:  Head: Normocephalic.  Eyes: Pupils are equal, round, and reactive to light.  Neck: Normal range of motion.   Cardiovascular: Intact distal pulses.   No murmur heard. Sinus brady   Pulmonary/Chest: Effort normal. No respiratory distress.  Abdominal: Soft. He exhibits no distension. There is no tenderness.  Musculoskeletal: Normal range of motion.  Neurological: He is alert and oriented to person, place, and time. No cranial nerve deficit. He exhibits normal muscle tone.  Skin: Skin is warm.  Psychiatric: He has a normal mood and affect.  Vitals reviewed.   ED Course  Procedures (including critical care time) Labs Review Labs Reviewed  CBC WITH DIFFERENTIAL/PLATELET - Abnormal; Notable for the following:    Hemoglobin 12.9 (*)    HCT 38.9 (*)    All other components within normal limits  I-STAT CHEM 8, ED - Abnormal; Notable for the following:    Calcium, Ion 1.24 (*)    All other components within normal limits  I-STAT TROPOININ, ED    Imaging Review No results found.   EKG Interpretation   Date/Time:  Thursday Nov 24 2014 20:43:59 EDT Ventricular Rate:  58 PR Interval:  156 QRS Duration: 92 QT Interval:  406 QTC Calculation: 398 R Axis:   26 Text Interpretation:  Sinus bradycardia Otherwise normal ECG No old  tracing to compare Confirmed by [facility] Center[facility]  MD, DAVID ([id number]) on 11/24/2014  8:48:26 PM      MDM  49 year old male with likely vasovagal type near-syncopal event. He denies any chest pain shortness breath or other concerning signs or symptoms. He's been able to ambulate and tolerate by mouth since that time without issue. His physical exam is benign without any concerning findings. His neurological exam is normal. His cardiac exam is normal aside from bradycardia sinus bradycardia likely secondary to metoprolol. He has no murmur. Orthostatics normal here. EKG sinus bradycardia without signs of arrhythmia or ischemia. CBC did have a slight decrease in hemoglobin at 12.4 from 13 1 year ago. However he denies any dark stools vomiting blood etc. Recommend follow-up PCP for  recheck in approximately 1 month. No other concerning findings on physical exam or labs. No medication issues on med review. Discussed return precautions with patient he was discharged.   Final diagnoses:  Near syncope        Bridgett Larssonhris Garl Speigner, MD 11/24/14 2249  Dione Boozeavid Glick, MD 11/24/14 2303

## 2014-11-24 NOTE — Discharge Instructions (Signed)
Near-Syncope Near-syncope (commonly known as near fainting) is sudden weakness, dizziness, or feeling like you might pass out. During an episode of near-syncope, you may also develop pale skin, have tunnel vision, or feel sick to your stomach (nauseous). Near-syncope may occur when getting up after sitting or while standing for a long time. It is caused by a sudden decrease in blood flow to the brain. This decrease can result from various causes or triggers, most of which are not serious. However, because near-syncope can sometimes be a sign of something serious, a medical evaluation is required. The specific cause is often not determined. HOME CARE INSTRUCTIONS  Monitor your condition for any changes. The following actions may help to alleviate any discomfort you are experiencing:  Have someone stay with you until you feel stable.  Lie down right away and prop your feet up if you start feeling like you might faint. Breathe deeply and steadily. Wait until all the symptoms have passed. Most of these episodes last only a few minutes. You may feel tired for several hours.   Drink enough fluids to keep your urine clear or pale yellow.   If you are taking blood pressure or heart medicine, get up slowly when seated or lying down. Take several minutes to sit and then stand. This can reduce dizziness.  Follow up with your health care provider as directed. SEEK IMMEDIATE MEDICAL CARE IF:   You have a severe headache.   You have unusual pain in the chest, abdomen, or back.   You are bleeding from the mouth or rectum, or you have black or tarry stool.   You have an irregular or very fast heartbeat.   You have repeated fainting or have seizure-like jerking during an episode.   You faint when sitting or lying down.   You have confusion.   You have difficulty walking.   You have severe weakness.   You have vision problems.  MAKE SURE YOU:   Understand these instructions.  Will  watch your condition.  Will get help right away if you are not doing well or get worse. Document Released: 07/01/2005 Document Revised: 07/06/2013 Document Reviewed: 12/04/2012 Exit[employer] Patient Information 2015 [employer]. This information is not intended to replace advice given to you by your health care provider. Make sure you discuss any questions you have with your health care provider.  

## 2014-12-01 ENCOUNTER — Inpatient Hospital Stay: Payer: Federal, State, Local not specified - PPO | Admitting: Internal Medicine

## 2014-12-05 ENCOUNTER — Encounter: Payer: Self-pay | Admitting: Internal Medicine

## 2014-12-05 ENCOUNTER — Ambulatory Visit (INDEPENDENT_AMBULATORY_CARE_PROVIDER_SITE_OTHER)
Admission: RE | Admit: 2014-12-05 | Discharge: 2014-12-05 | Disposition: A | Discharge status: Home / Self Care | Payer: Federal, State, Local not specified - PPO | Source: Ambulatory Visit | Attending: Internal Medicine | Admitting: Internal Medicine

## 2014-12-05 ENCOUNTER — Ambulatory Visit (INDEPENDENT_AMBULATORY_CARE_PROVIDER_SITE_OTHER): Payer: Federal, State, Local not specified - PPO | Admitting: Internal Medicine

## 2014-12-05 VITALS — BP 118/78 | HR 91 | Temp 98.7°F | Ht 71.0 in | Wt 227.8 lb

## 2014-12-05 DIAGNOSIS — Z23 Encounter for immunization: Secondary | ICD-10-CM | POA: Diagnosis not present

## 2014-12-05 DIAGNOSIS — M542 Cervicalgia: Secondary | ICD-10-CM | POA: Diagnosis not present

## 2014-12-05 DIAGNOSIS — I10 Essential (primary) hypertension: Secondary | ICD-10-CM

## 2014-12-05 DIAGNOSIS — E781 Pure hyperglyceridemia: Secondary | ICD-10-CM

## 2014-12-05 DIAGNOSIS — M5412 Radiculopathy, cervical region: Secondary | ICD-10-CM

## 2014-12-05 MED ORDER — AZILSARTAN MEDOXOMIL 40 MG PO TABS: 1.0000 | ORAL_TABLET | Freq: Every day | ORAL | Status: DC

## 2014-12-05 NOTE — Patient Instructions (Signed)

## 2014-12-05 NOTE — Progress Notes (Signed)
Subjective:  Patient ID: Andre Stein, male    DOB: [birth year]  Age: 49 y.o. MRN: [id number]  CC: Hypertension   HPI Andre Stein presents for dizziness - " for the last 3 weeks I feel lightheaded and woozy when I stand up after laying down for any length of time." He also complains of one month history of rt side neck pain associated with turning his head to the right frequently as he works as a Designer, fashion/clothing.  Outpatient Prescriptions Prior to Visit  Medication Sig Dispense Refill  . desloratadine (CLARINEX) 5 MG tablet Take 1 tablet (5 mg total) by mouth daily. 90 tablet 3  . esomeprazole (NEXIUM) 40 MG capsule take 1 capsule by mouth once daily 90 capsule 1  . metoprolol succinate (TOPROL-XL) 25 MG 24 hr tablet Take 1 tablet (25 mg total) by mouth daily. 90 tablet 3  . mometasone (NASONEX) 50 MCG/ACT nasal spray Place 2 sprays into the nose daily. 17 g 1  . naproxen (NAPROSYN) 500 MG tablet Take 1 tablet (500 mg total) by mouth 2 (two) times daily. 30 tablet 0  . NONFORMULARY OR COMPOUNDED ITEM Apply 1 application topically daily as needed (topical cream for skin dryness/irritation from dermatolgist).    Marland Kitchen OVER THE COUNTER MEDICATION Place 3 drops into both ears daily as needed ("for sinus and ear infection medication").    . Azilsartan-Chlorthalidone 40-12.5 MG TABS Take 1 tablet by mouth daily. 70 tablet 0   No facility-administered medications prior to visit.    ROS Review of Systems  Constitutional: Negative.  Negative for fever, chills, diaphoresis, activity change, appetite change, fatigue and unexpected weight change.  HENT: Negative.  Negative for congestion, sinus pressure and trouble swallowing.   Eyes: Negative.   Respiratory: Negative.  Negative for apnea, cough, choking, chest tightness, shortness of breath, wheezing and stridor.   Cardiovascular: Negative.  Negative for chest pain, palpitations and leg swelling.  Gastrointestinal: Negative.  Negative for  nausea, vomiting, abdominal pain, diarrhea, constipation and blood in stool.  Endocrine: Negative.   Genitourinary: Negative.   Musculoskeletal: Positive for neck pain. Negative for myalgias, back pain, joint swelling, arthralgias, gait problem and neck stiffness.  Skin: Negative.   Allergic/Immunologic: Negative.   Neurological: Positive for dizziness and light-headedness. Negative for tremors, seizures, syncope, facial asymmetry, speech difficulty, weakness, numbness and headaches.  Hematological: Negative.  Negative for adenopathy. Does not bruise/bleed easily.  Psychiatric/Behavioral: Negative.     Objective:  BP 118/78 mmHg  Pulse 91  Temp(Src) 98.7 F (37.1 C) (Oral)  Ht  (1.803 m)  Wt 227 lb 12.8 oz (103.329 kg)  BMI 31.79 kg/m2  SpO2 97%  BP Readings from Last 3 Encounters:  12/05/14 118/78  11/24/14 123/85  09/26/14 144/102    Wt Readings from Last 3 Encounters:  12/05/14 227 lb 12.8 oz (103.329 kg)  11/24/14 230 lb 5 oz (104.469 kg)  09/26/14 237 lb (107.502 kg)    Physical Exam  Constitutional: He is oriented to person, place, and time. He appears well-developed and well-nourished.  Non-toxic appearance. He does not have a sickly appearance. He does not appear ill. No distress.  HENT:  Head: Normocephalic and atraumatic.  Mouth/Throat: Oropharynx is clear and moist. No oropharyngeal exudate.  Eyes: Conjunctivae are normal. Right eye exhibits no discharge. Left eye exhibits no discharge. No scleral icterus.  Neck: Normal range of motion. Neck supple. No JVD present. No tracheal deviation present. No thyromegaly present.  Cardiovascular: Normal rate,  regular rhythm, normal heart sounds and intact distal pulses.  Exam reveals no gallop and no friction rub.   No murmur heard. Pulmonary/Chest: Effort normal and breath sounds normal. No stridor. No respiratory distress. He has no wheezes. He has no rales. He exhibits no tenderness.  Abdominal: Soft. Bowel sounds  are normal. He exhibits no distension and no mass. There is no tenderness. There is no rebound and no guarding.  Musculoskeletal: Normal range of motion. He exhibits no edema or tenderness.       Cervical back: Normal. He exhibits normal range of motion, no tenderness, no bony tenderness, no swelling, no edema, no deformity, no laceration, no pain, no spasm and normal pulse.  Lymphadenopathy:    He has no cervical adenopathy.  Neurological: He is alert and oriented to person, place, and time. He has normal strength. He displays no atrophy, no tremor and normal reflexes. No cranial nerve deficit or sensory deficit. He exhibits normal muscle tone. He displays no seizure activity. Coordination normal.  Reflex Scores:      Tricep reflexes are 1+ on the right side and 1+ on the left side.      Bicep reflexes are 1+ on the right side and 1+ on the left side.      Brachioradialis reflexes are 1+ on the right side and 1+ on the left side.      Patellar reflexes are 1+ on the right side and 1+ on the left side.      Achilles reflexes are 1+ on the right side and 1+ on the left side. Skin: Skin is warm and dry. No rash noted. He is not diaphoretic. No erythema. No pallor.  Vitals reviewed.   Lab Results  Component Value Date   WBC 5.1 11/24/2014   HGB 14.3 11/24/2014   HCT 42.0 11/24/2014   PLT 185 11/24/2014   GLUCOSE 93 11/24/2014   CHOL 235* 12/27/2013   TRIG 489.0* 12/27/2013   HDL 36.60* 12/27/2013   LDLDIRECT 56.3 08/12/2011   LDLCALC 95 10/30/2012   ALT 28 12/27/2013   AST 21 12/27/2013   NA 142 11/24/2014   K 4.1 11/24/2014   CL 105 11/24/2014   CREATININE 1.20 11/24/2014   BUN 15 11/24/2014   CO2 29 12/27/2013   TSH 1.63 12/27/2013   PSA 1.78 12/27/2013    No results found.  Assessment & Plan:   Andre Stein was seen today for hypertension.  Diagnoses and all orders for this visit:  Essential hypertension - he is experiencing orthostasis and I believe is due to low BP, will  discontinue the HCTZ and will cont the BB and ARB Orders: -     CBC with Differential/Platelet; Future -     Basic metabolic panel; Future -     Azilsartan Medoxomil (EDARBI) 40 MG TABS; Take 1 tablet by mouth daily.  Pure hyperglyceridemia - will recheck his trigs level today, will treat  If it is >500 Orders: -     Lipid panel; Future  Brachial neuritis or radiculitis Orders: -     DG Cervical Spine Complete; Future  Neck pain on right side - [place] films show slight progression in DDD, will follow for now Orders: -     DG Cervical Spine Complete; Future   I have discontinued Mr. Springsteen Azilsartan-Chlorthalidone. I am also having him start on Azilsartan Medoxomil. Additionally, I am having him maintain his OVER THE COUNTER MEDICATION, NONFORMULARY OR COMPOUNDED ITEM, esomeprazole, metoprolol succinate, mometasone, naproxen, and desloratadine.  Meds ordered this encounter  Medications  . Azilsartan Medoxomil (EDARBI) 40 MG TABS    Sig: Take 1 tablet by mouth daily.    Dispense:  70 tablet    Refill:  0     Follow-up: Return in about 6 weeks (around 01/16/2015).  Sanda Lingerhomas Meri Pelot, MD

## 2015-04-13 ENCOUNTER — Encounter: Payer: Self-pay | Admitting: Internal Medicine

## 2015-04-13 ENCOUNTER — Other Ambulatory Visit (INDEPENDENT_AMBULATORY_CARE_PROVIDER_SITE_OTHER): Payer: Federal, State, Local not specified - PPO

## 2015-04-13 ENCOUNTER — Ambulatory Visit (INDEPENDENT_AMBULATORY_CARE_PROVIDER_SITE_OTHER): Payer: Federal, State, Local not specified - PPO | Admitting: Internal Medicine

## 2015-04-13 ENCOUNTER — Ambulatory Visit: Payer: Federal, State, Local not specified - PPO | Admitting: Internal Medicine

## 2015-04-13 VITALS — BP 120/80 | HR 60 | Temp 97.6°F | Resp 16 | Ht 71.0 in | Wt 230.0 lb

## 2015-04-13 DIAGNOSIS — E781 Pure hyperglyceridemia: Secondary | ICD-10-CM | POA: Diagnosis not present

## 2015-04-13 DIAGNOSIS — K219 Gastro-esophageal reflux disease without esophagitis: Secondary | ICD-10-CM

## 2015-04-13 DIAGNOSIS — I10 Essential (primary) hypertension: Secondary | ICD-10-CM

## 2015-04-13 DIAGNOSIS — H6981 Other specified disorders of Eustachian tube, right ear: Secondary | ICD-10-CM

## 2015-04-13 DIAGNOSIS — J301 Allergic rhinitis due to pollen: Secondary | ICD-10-CM

## 2015-04-13 LAB — BASIC METABOLIC PANEL
BUN: 17 mg/dL (ref 6–23)
CO2: 32 meq/L (ref 19–32)
Calcium: 9.7 mg/dL (ref 8.4–10.5)
Chloride: 105 mEq/L (ref 96–112)
Creatinine, Ser: 1.21 mg/dL (ref 0.40–1.50)
GFR: 81.39 mL/min (ref >60.00)
Glucose, Bld: 94 mg/dL (ref 70–99)
POTASSIUM: 4.4 meq/L (ref 3.5–5.1)
Sodium: 141 mEq/L (ref 135–145)

## 2015-04-13 LAB — LIPID PANEL
CHOL/HDL RATIO: 7
CHOLESTEROL: 207 mg/dL — AB (ref 0–200)
HDL: 29.8 mg/dL — ABNORMAL LOW (ref >39.00)
NONHDL: 176.71
TRIGLYCERIDES: 334 mg/dL — AB (ref 0.0–149.0)
VLDL: 66.8 mg/dL — ABNORMAL HIGH (ref 0.0–40.0)

## 2015-04-13 LAB — LDL CHOLESTEROL, DIRECT: LDL DIRECT: 53 mg/dL

## 2015-04-13 MED ORDER — ESOMEPRAZOLE MAGNESIUM 40 MG PO CPDR: DELAYED_RELEASE_CAPSULE | ORAL | Status: DC

## 2015-04-13 MED ORDER — AZILSARTAN MEDOXOMIL 40 MG PO TABS
1.0000 | ORAL_TABLET | Freq: Every day | ORAL | Status: DC
Start: 2015-04-13 — End: 2020-09-25

## 2015-04-13 MED ORDER — DESLORATADINE 5 MG PO TABS: 5.0000 mg | ORAL_TABLET | Freq: Every day | ORAL | Status: DC

## 2015-04-13 NOTE — Progress Notes (Signed)
Subjective:  Andre Stein ID: Andre Stein, male    DOB: April 27, 1966  Age: 49 y.o. MRN: [id number]  CC: Hypertension; Hyperlipidemia; and Rash   HPI Andre Stein presents for follow-up on hypertension, he also has a few acne lesions and needs a refill of Cleocin topical lotion. He feels well and offers no complaints.  Outpatient Prescriptions Prior to Visit  Medication Sig Dispense Refill  . naproxen (NAPROSYN) 500 MG tablet Take 1 tablet (500 mg total) by mouth 2 (two) times daily. 30 tablet 0  . NONFORMULARY OR COMPOUNDED ITEM Apply 1 application topically daily as needed (topical cream for skin dryness/irritation from dermatolgist).    Marland Kitchen OVER THE COUNTER MEDICATION Place 3 drops into both ears daily as needed ("for sinus and ear infection medication").    . Azilsartan Medoxomil (EDARBI) 40 MG TABS Take 1 tablet by mouth daily. 70 tablet 0  . desloratadine (CLARINEX) 5 MG tablet Take 1 tablet (5 mg total) by mouth daily. 90 tablet 3  . esomeprazole (NEXIUM) 40 MG capsule take 1 capsule by mouth once daily 90 capsule 1  . metoprolol succinate (TOPROL-XL) 25 MG 24 hr tablet Take 1 tablet (25 mg total) by mouth daily. 90 tablet 3  . mometasone (NASONEX) 50 MCG/ACT nasal spray Place 2 sprays into the nose daily. 17 g 1   No facility-administered medications prior to visit.    ROS Review of Systems  Constitutional: Negative.  Negative for fever, chills, diaphoresis, appetite change and fatigue.  HENT: Negative.   Eyes: Negative.   Respiratory: Positive for apnea. Negative for cough, choking, chest tightness, shortness of breath, wheezing and stridor.   Cardiovascular: Negative.  Negative for chest pain, palpitations and leg swelling.  Gastrointestinal: Negative.  Negative for nausea, vomiting, abdominal pain, diarrhea, constipation and blood in stool.  Endocrine: Negative.   Genitourinary: Negative.   Musculoskeletal: Negative.  Negative for neck pain.  Skin: Positive for rash.    Allergic/Immunologic: Negative.   Neurological: Negative.  Negative for dizziness, syncope, facial asymmetry, speech difficulty, light-headedness, numbness and headaches.  Hematological: Negative.  Negative for adenopathy. Does not bruise/bleed easily.  Psychiatric/Behavioral: Negative.     Objective:  BP 120/80 mmHg  Pulse 60  Temp(Src) 97.6 F (36.4 C) (Oral)  Resp 16  Ht  (1.803 m)  Wt 230 lb (104.327 kg)  BMI 32.09 kg/m2  SpO2 96%  BP Readings from Last 3 Encounters:  04/13/15 120/80  12/05/14 118/78  11/24/14 123/85    Wt Readings from Last 3 Encounters:  04/13/15 230 lb (104.327 kg)  12/05/14 227 lb 12.8 oz (103.329 kg)  11/24/14 230 lb 5 oz (104.469 kg)    Physical Exam  Constitutional: He is oriented to person, place, and time. No distress.  HENT:  Head: Normocephalic and atraumatic.  Mouth/Throat: Oropharynx is clear and moist. No oropharyngeal exudate.  Eyes: Conjunctivae are normal. Right eye exhibits no discharge. Left eye exhibits no discharge. No scleral icterus.  Neck: Normal range of motion. Neck supple. No JVD present. No tracheal deviation present. No thyromegaly present.  Cardiovascular: Normal rate, regular rhythm, normal heart sounds and intact distal pulses.  Exam reveals no gallop and no friction rub.   No murmur heard. Pulmonary/Chest: Effort normal and breath sounds normal. No stridor. No respiratory distress. He has no wheezes. He has no rales. He exhibits no tenderness.  Abdominal: Soft. Bowel sounds are normal. He exhibits no distension and no mass. There is no tenderness. There is no rebound  and no guarding.  Musculoskeletal: Normal range of motion. He exhibits no edema or tenderness.  Lymphadenopathy:    He has no cervical adenopathy.  Neurological: He is oriented to person, place, and time.  Skin: Skin is warm and dry. Rash noted. He is not diaphoretic. No erythema. No pallor.  There are eczematous patches over his posterior neck and  bilateral antecubital fossae There are a few acne pustules over the back of his neck  Vitals reviewed.   Lab Results  Component Value Date   WBC 5.1 11/24/2014   HGB 14.3 11/24/2014   HCT 42.0 11/24/2014   PLT 185 11/24/2014   GLUCOSE 94 04/13/2015   CHOL 207* 04/13/2015   TRIG 334.0* 04/13/2015   HDL 29.80* 04/13/2015   LDLDIRECT 53.0 04/13/2015   LDLCALC 95 10/30/2012   ALT 28 12/27/2013   AST 21 12/27/2013   NA 141 04/13/2015   K 4.4 04/13/2015   CL 105 04/13/2015   CREATININE 1.21 04/13/2015   BUN 17 04/13/2015   CO2 32 04/13/2015   TSH 1.63 12/27/2013   PSA 1.78 12/27/2013    Dg Cervical Spine Complete  12/05/2014   CLINICAL DATA:  Neck stiffness.  Right arm pain.  No known injury.  EXAM: CERVICAL SPINE  4+ VIEWS  COMPARISON:  02/09/2010.  FINDINGS: Motion artifact present. Multilevel degenerative changes present. No evidence of fracture or dislocation. Pulmonary apices are clear.  IMPRESSION: No acute abnormality. Multilevel degenerative change present. Degenerative changes have progressed slightly from prior exam .   Electronically Signed   By: Maisie Fus  Register   On: 12/05/2014 10:27    Assessment & Plan:   Ledarrius was seen today for hypertension, hyperlipidemia and rash.  Diagnoses and all orders for this visit:  Essential hypertension- his blood pressure is well-controlled, lytes and renal function are stable. -     Azilsartan Medoxomil (EDARBI) 40 MG TABS; Take 1 tablet by mouth daily. -     Basic metabolic panel; Future  Gastroesophageal reflux disease, esophagitis presence not specified- his symptoms are well controlled. -     esomeprazole (NEXIUM) 40 MG capsule; take 1 capsule by mouth once daily  Eustachian tube dysfunction, right -     desloratadine (CLARINEX) 5 MG tablet; Take 1 tablet (5 mg total) by mouth daily.  Allergic rhinitis due to pollen -     desloratadine (CLARINEX) 5 MG tablet; Take 1 tablet (5 mg total) by mouth daily.  Pure  hyperglyceridemia- his triglycerides have improved, he does not need a medication for this at this time, I've asked him to improve his lifestyle modifications -     Lipid panel; Future   I have discontinued Mr. Tipps metoprolol succinate and mometasone. I am also having him maintain his OVER THE COUNTER MEDICATION, NONFORMULARY OR COMPOUNDED ITEM, naproxen, clindamycin, Azilsartan Medoxomil, esomeprazole, and desloratadine.  Meds ordered this encounter  Medications  . clindamycin (CLEOCIN-T) 1 % external solution    Sig: Apply 1 application topically 2 (two) times daily.  . Azilsartan Medoxomil (EDARBI) 40 MG TABS    Sig: Take 1 tablet by mouth daily.    Dispense:  90 tablet    Refill:  1  . esomeprazole (NEXIUM) 40 MG capsule    Sig: take 1 capsule by mouth once daily    Dispense:  90 capsule    Refill:  1  . desloratadine (CLARINEX) 5 MG tablet    Sig: Take 1 tablet (5 mg total) by mouth daily.  Dispense:  90 tablet    Refill:  3     Follow-up: Return in about 4 months (around 08/13/2015).  Sanda Linger, MD

## 2015-04-13 NOTE — Progress Notes (Signed)
Pre visit review using our clinic review tool, if applicable. No additional management support is needed unless otherwise documented below in the visit note. 

## 2015-04-13 NOTE — Patient Instructions (Signed)

## 2015-06-16 IMAGING — CR DG CHEST 2V
2 series · 2 of 2 positions shown · non-contrast
Comparison: None.

CLINICAL DATA: With chest pain. Ear pain and ringing. Cough and
congestion for 2 weeks. Nausea. Hypertension.

EXAM:
CHEST  2 VIEW

[w chest pa]
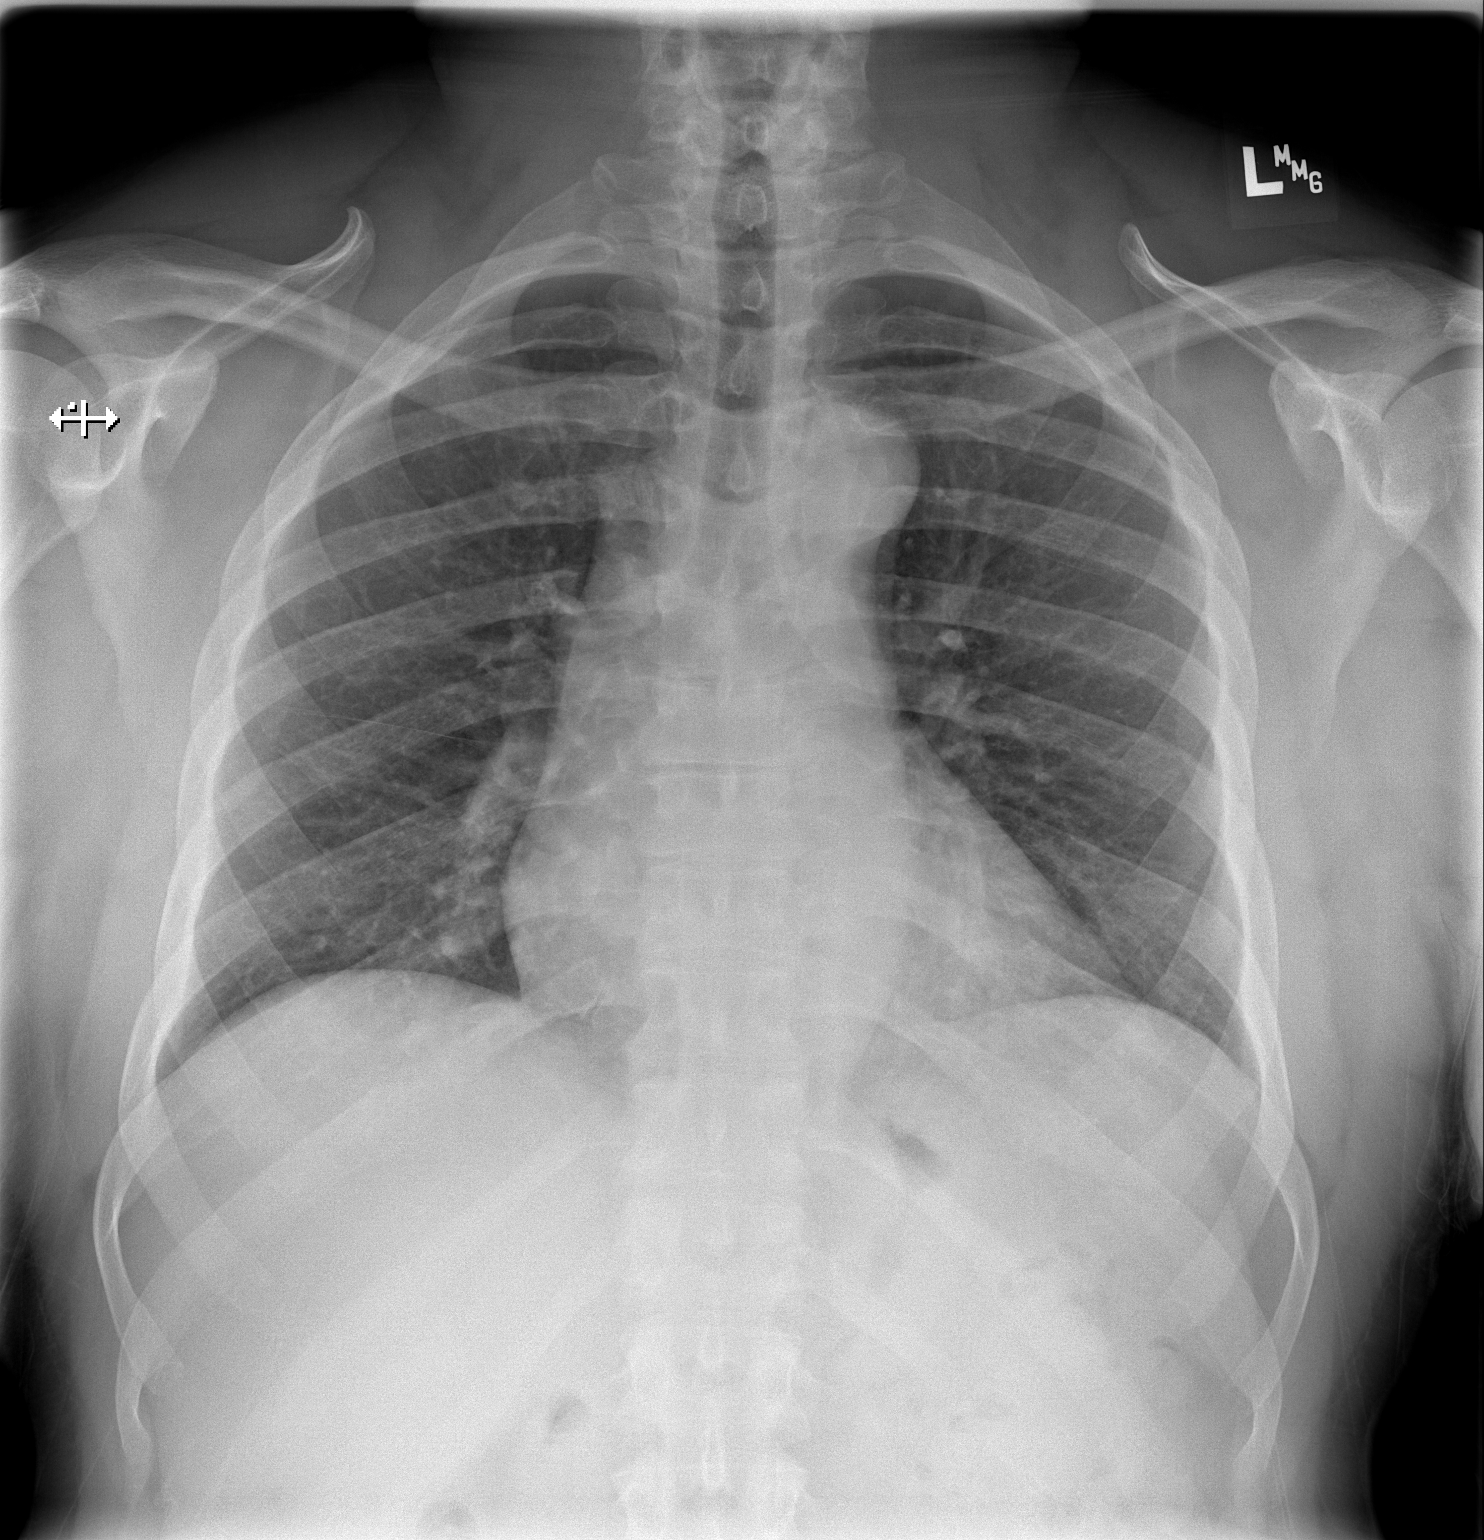

[w chest lat]
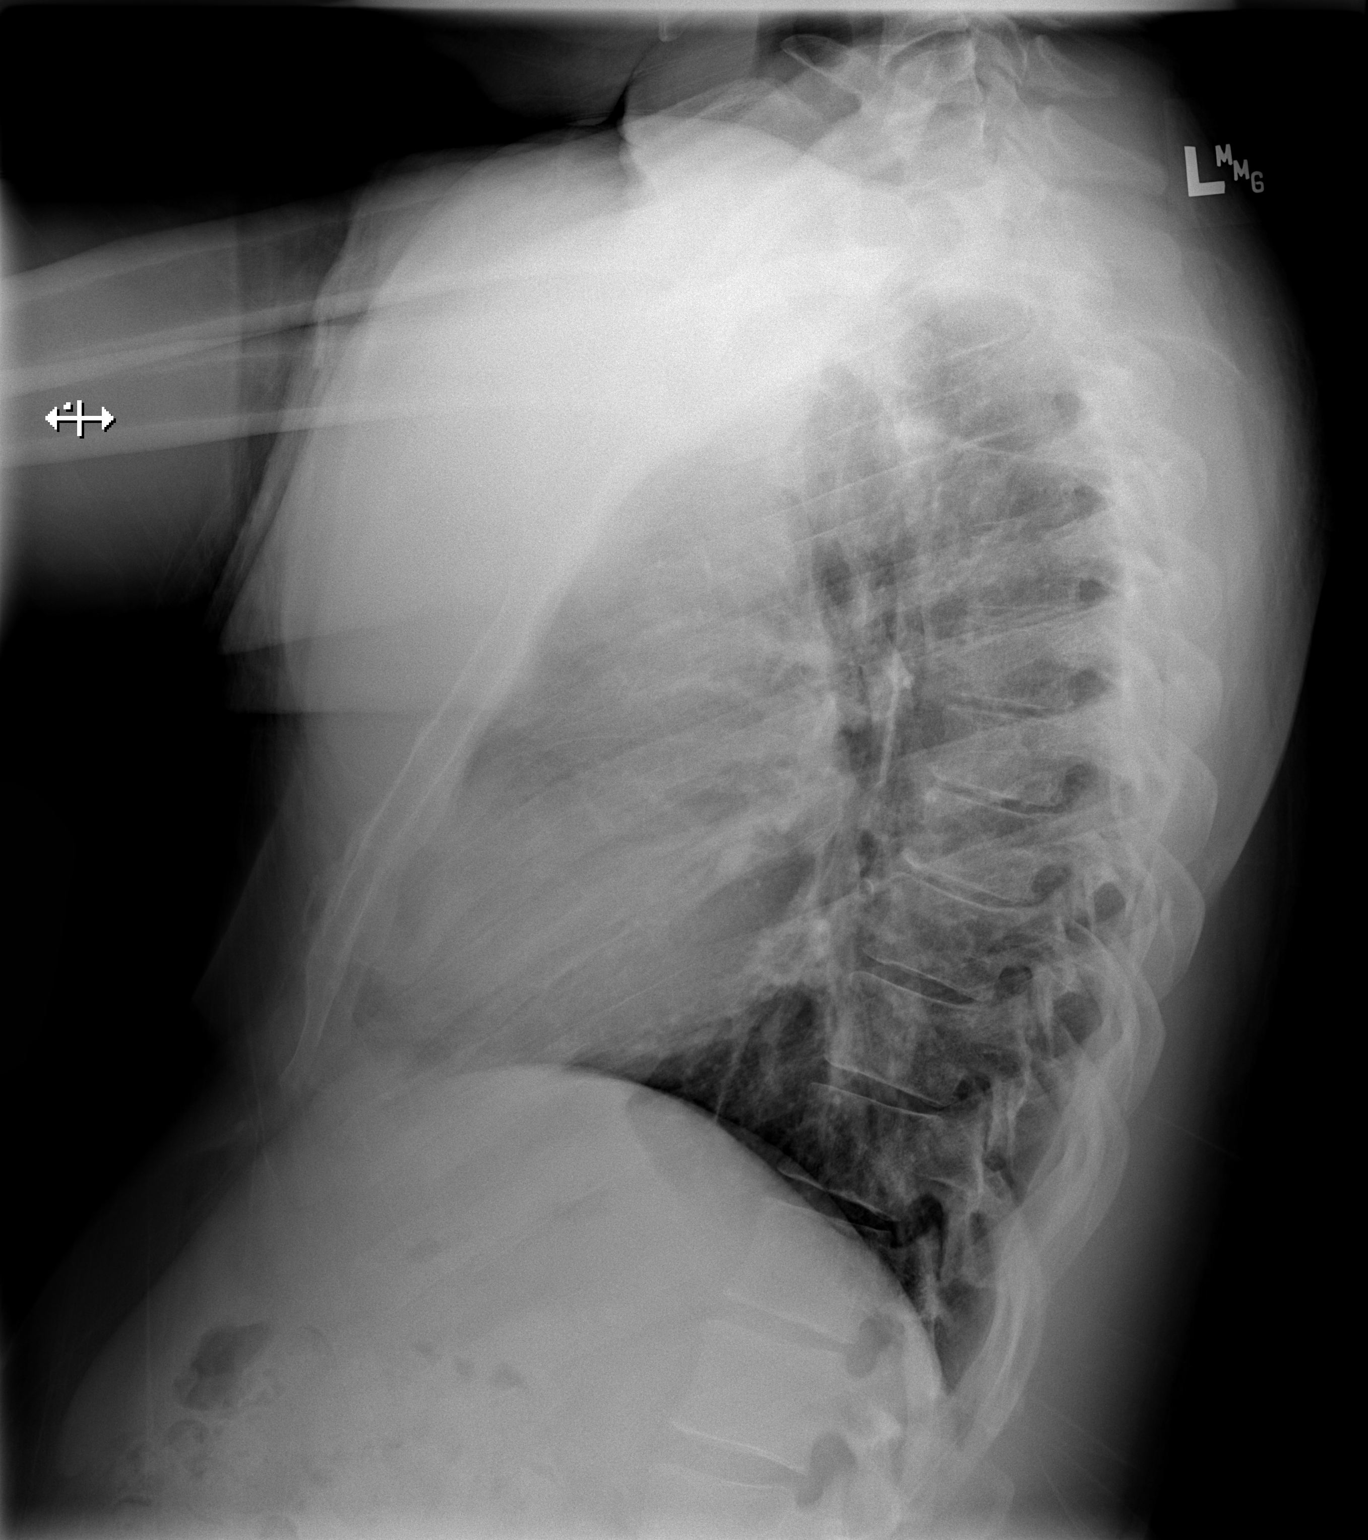

[2 of 2 positions shown; findings below may reference images not displayed]

FINDINGS: The heart size and mediastinal contours are within normal limits.
Both lungs are clear. The visualized skeletal structures are
unremarkable.
IMPRESSION: No active cardiopulmonary disease.

## 2015-06-27 ENCOUNTER — Emergency Department (HOSPITAL_COMMUNITY): Payer: Federal, State, Local not specified - PPO

## 2015-06-27 ENCOUNTER — Emergency Department (HOSPITAL_COMMUNITY)
Admission: EM | Admit: 2015-06-27 | Discharge: 2015-06-27 | Disposition: A | Discharge status: Home / Self Care | Payer: Federal, State, Local not specified - PPO | Attending: Emergency Medicine | Admitting: Emergency Medicine

## 2015-06-27 ENCOUNTER — Encounter (HOSPITAL_COMMUNITY): Payer: Self-pay | Admitting: Emergency Medicine

## 2015-06-27 DIAGNOSIS — Z79899 Other long term (current) drug therapy: Secondary | ICD-10-CM | POA: Diagnosis not present

## 2015-06-27 DIAGNOSIS — K219 Gastro-esophageal reflux disease without esophagitis: Secondary | ICD-10-CM | POA: Diagnosis not present

## 2015-06-27 DIAGNOSIS — R1010 Upper abdominal pain, unspecified: Secondary | ICD-10-CM | POA: Diagnosis present

## 2015-06-27 DIAGNOSIS — I10 Essential (primary) hypertension: Secondary | ICD-10-CM | POA: Diagnosis not present

## 2015-06-27 LAB — COMPREHENSIVE METABOLIC PANEL
ALBUMIN: 4.1 g/dL (ref 3.5–5.0)
ALT: 29 U/L (ref 17–63)
ANION GAP: 7 (ref 5–15)
AST: 31 U/L (ref 15–41)
Alkaline Phosphatase: 67 U/L (ref 38–126)
BUN: 12 mg/dL (ref 6–20)
CO2: 27 mmol/L (ref 22–32)
Calcium: 9.4 mg/dL (ref 8.9–10.3)
Chloride: 103 mmol/L (ref 101–111)
Creatinine, Ser: 1.11 mg/dL (ref 0.61–1.24)
GFR calc Af Amer: >60 mL/min (ref >60)
GFR calc non Af Amer: >60 mL/min (ref >60)
GLUCOSE: 121 mg/dL — AB (ref 65–99)
Potassium: 4.7 mmol/L (ref 3.5–5.1)
Sodium: 137 mmol/L (ref 135–145)
TOTAL PROTEIN: 7.5 g/dL (ref 6.5–8.1)
Total Bilirubin: 1.4 mg/dL — ABNORMAL HIGH (ref 0.3–1.2)

## 2015-06-27 LAB — URINALYSIS, ROUTINE W REFLEX MICROSCOPIC
BILIRUBIN URINE: NEGATIVE
Glucose, UA: NEGATIVE mg/dL
HGB URINE DIPSTICK: NEGATIVE
Ketones, ur: NEGATIVE mg/dL
Leukocytes, UA: NEGATIVE
Nitrite: NEGATIVE
Protein, ur: NEGATIVE mg/dL
Specific Gravity, Urine: 1.025 (ref 1.005–1.030)
pH: 6 (ref 5.0–8.0)

## 2015-06-27 LAB — CBC
HCT: 46.1 % (ref 39.0–52.0)
HEMOGLOBIN: 15.3 g/dL (ref 13.0–17.0)
MCH: 29.7 pg (ref 26.0–34.0)
MCHC: 33.2 g/dL (ref 30.0–36.0)
MCV: 89.5 fL (ref 78.0–100.0)
Platelets: 204 10*3/uL (ref 150–400)
RBC: 5.15 MIL/uL (ref 4.22–5.81)
RDW: 13.3 % (ref 11.5–15.5)
WBC: 7.6 10*3/uL (ref 4.0–10.5)

## 2015-06-27 LAB — LIPASE, BLOOD: Lipase: 37 U/L (ref 11–51)

## 2015-06-27 MED ORDER — MORPHINE SULFATE (PF) 4 MG/ML IV SOLN
4.0000 mg | Freq: Once | INTRAVENOUS | Status: AC
  Administered 2015-06-27: 4 mg via INTRAVENOUS
  Filled 2015-06-27: qty 1

## 2015-06-27 MED ORDER — ESOMEPRAZOLE MAGNESIUM 40 MG PO CPDR: DELAYED_RELEASE_CAPSULE | ORAL | Status: DC

## 2015-06-27 MED ORDER — GI COCKTAIL ~~LOC~~
30.0000 mL | Freq: Once | ORAL | Status: AC
  Administered 2015-06-27: 30 mL via ORAL
  Filled 2015-06-27: qty 30

## 2015-06-27 MED ORDER — ONDANSETRON HCL 4 MG/2ML IJ SOLN
4.0000 mg | Freq: Once | INTRAMUSCULAR | Status: AC
  Administered 2015-06-27: 4 mg via INTRAVENOUS
  Filled 2015-06-27: qty 2

## 2015-06-27 NOTE — ED Notes (Signed)
Pt given Ginger Ale and Crackers for PO challenge - tolerated w/o difficulty.

## 2015-06-27 NOTE — Discharge Instructions (Signed)

## 2015-06-27 NOTE — ED Provider Notes (Signed)
CSN: [id number]     Arrival date & time 06/27/15  0106 History   By signing my name below, I, Arlan Organ, attest that this documentation has been prepared under the direction and in the presence of Shon Baton, MD.  Electronically Signed: Arlan Organ, ED Scribe. 06/27/2015. 3:50 AM.   Chief Complaint  Patient presents with  . Abdominal Pain   The history is provided by the patient. No language interpreter was used.    HPI Comments: Andre Stein is a 49 y.o. male with a PMHx of GERD, umbilical hernia, and HTN who presents to the Emergency Department complaining of constant, ongoing upper abdominal pain onset 10:00 AM this morning. Pt currently works 3rd shift and often snacks at night time. Pain is described as sharp and rated 10/10. No aggravating or alleviating factors at this time. OTC Pepto Bismol attempted at home without any improvement. No recent fever, chills, nausea, vomiting, diarrhea, chest pain, or shortness of breath. Andre Stein states current pain does not feel similar to GERD pain. Previous GERD symptoms radiated into the chest. PSHx includes umbilical hernia repair 10/2011.  PCP: Sanda Linger, MD    Past Medical History  Diagnosis Date  . Hypertension   . GERD (gastroesophageal reflux disease)   . Bilateral inguinal hernia   . Chest pain   . Abdominal pain   . Umbilical hernia     ventral   Past Surgical History  Procedure Laterality Date  . Umbilical hernia repair  10/29/11  . Hernia repair  10/29/11    BIH and lysis of adhesions  . Inguinal hernia repair      left    Family History  Problem Relation Age of Onset  . Arthritis Other   . Diabetes Other   . Hypertension Other   . Stroke Other   . Heart disease Father   . Cancer Mother     pt believes it was colon   Social History  Substance Use Topics  . Smoking status: Never Smoker   . Smokeless tobacco: Never Used  . Alcohol Use: No    Review of Systems  Constitutional: Negative for fever  and chills.  Respiratory: Negative for shortness of breath.   Cardiovascular: Negative for chest pain.  Gastrointestinal: Positive for abdominal pain. Negative for nausea and vomiting.  Musculoskeletal: Negative for back pain.  Neurological: Negative for headaches.  Psychiatric/Behavioral: Negative for confusion.  All other systems reviewed and are negative.     Allergies  Review of patient's allergies indicates no known allergies.  Home Medications   Prior to Admission medications   Medication Sig Start Date End Date Taking? Authorizing Provider  Azilsartan Medoxomil (EDARBI) 40 MG TABS Take 1 tablet by mouth daily. 04/13/15  Yes Etta Grandchild, MD  desloratadine (CLARINEX) 5 MG tablet Take 1 tablet (5 mg total) by mouth daily. 04/13/15  Yes Etta Grandchild, MD  esomeprazole (NEXIUM) 40 MG capsule take 1 capsule by mouth once daily 06/27/15   Shon Baton, MD   Triage Vitals: BP 130/91 mmHg  Pulse 85  Temp(Src) 97.9 F (36.6 C)  Resp 15  SpO2 96%   Physical Exam  Constitutional: He is oriented to person, place, and time. He appears well-developed and well-nourished. No distress.  HENT:  Head: Normocephalic and atraumatic.  Eyes: Pupils are equal, round, and reactive to light.  Cardiovascular: Normal rate, regular rhythm and normal heart sounds.   No murmur heard. Pulmonary/Chest: Effort normal and breath sounds  normal. No respiratory distress. He has no wheezes.  Abdominal: Soft. Bowel sounds are normal. There is tenderness. There is no rebound and no guarding.  Epigastric tenderness to palpation without rebound or guarding, no masses noted  Musculoskeletal: He exhibits no edema.  Neurological: He is alert and oriented to person, place, and time.  Skin: Skin is warm and dry.  Psychiatric: He has a normal mood and affect.  Nursing note and vitals reviewed.   ED Course  Procedures (including critical care time)  DIAGNOSTIC STUDIES: Oxygen Saturation is 100% on RA,  Normal by my interpretation.    COORDINATION OF CARE: 3:46 AM- Will order Lipase, CMP, CBC, EKG, and urinalysis. Discussed treatment plan with pt at bedside and pt agreed to plan.    Labs Review Labs Reviewed  COMPREHENSIVE METABOLIC PANEL - Abnormal; Notable for the following:    Glucose, Bld 121 (*)    Total Bilirubin 1.4 (*)    All other components within normal limits  LIPASE, BLOOD  CBC  URINALYSIS, ROUTINE W REFLEX MICROSCOPIC (NOT AT [facility])    Imaging Review Dg Abd Acute W/chest  06/27/2015  CLINICAL DATA:  Acute onset of upper abdominal pain and mid chest pain. Initial encounter. EXAM: DG ABDOMEN ACUTE W/ 1V CHEST COMPARISON:  Chest radiograph performed 06/06/2013, and lumbar spine radiographs performed 02/09/2010 FINDINGS: The lungs are well-aerated. Pulmonary vascularity is at the upper limits of normal. There is no evidence of focal opacification, pleural effusion or pneumothorax. The cardiomediastinal silhouette is within normal limits. The visualized bowel gas pattern is unremarkable. Scattered stool and air are seen within the colon; there is no evidence of small bowel dilatation to suggest obstruction. No free intra-abdominal air is identified on the provided upright view. No acute osseous abnormalities are seen; the sacroiliac joints are unremarkable in appearance. IMPRESSION: 1. Unremarkable bowel gas pattern; no free intra-abdominal air seen. Small to moderate amount of stool noted in the colon. 2. No acute cardiopulmonary process seen. Electronically Signed   By: Roanna RaiderJeffery  Chang M.D.   On: 06/27/2015 05:13   I have personally reviewed and evaluated these images and lab results as part of my medical decision-making.   EKG Interpretation   Date/Time:  Tuesday June 27 2015 01:10:22 EST Ventricular Rate:  76 PR Interval:  156 QRS Duration: 90 QT Interval:  354 QTC Calculation: 398 R Axis:   106 Text Interpretation:  Normal sinus rhythm Right atrial enlargement   Rightward axis Borderline ECG Similar to prior Confirmed by HORTON  MD,  COURTNEY ([id number]) on 06/28/2015 3:52:31 AM      MDM   Final diagnoses:  Gastroesophageal reflux disease, esophagitis presence not specified    Patient presents with abdominal pain. History of GERD but is not taking his daily PPI. He is tender in the epigastrium without rebound or guarding. Basic labwork obtained and reassuring. Normal LFTs and normal lipase. Acute abdominal series without evidence of obstruction. He does have a small to moderate amount of stool noted.  Patient was given a GI cocktail, Zofran, and morphine. Patient feels that the GI cocktail helped him the most. On recheck, abdominal exam is benign and he reports improvement of symptoms. Discussed with patient taking his PPI daily as this may be related to GERD versus peptic ulcer disease versus gastritis. Patient stated understanding.  I personally performed the services described in this documentation, which was scribed in my presence. The recorded information has been reviewed and is accurate.  After history, exam, and medical workup  I feel the patient has been appropriately medically screened and is safe for discharge home. Pertinent diagnoses were discussed with the patient. Patient was given return precautions.   Shon Baton, MD 06/28/15 [phone]

## 2015-06-27 NOTE — ED Notes (Signed)
Pt. reports mid abdominal pain with chills and nausea onset this morning , denies emesis , mild diarrhea ( loose stools )

## 2015-06-27 NOTE — ED Notes (Signed)
Pt verbalized understanding of d/c instructions, prescriptions, and follow-up care. No further questions/concerns, VSS, assisted to lobby in wheelchair.  

## 2015-06-27 NOTE — ED Notes (Signed)
Pt transported to Xray. 

## 2016-07-15 ENCOUNTER — Encounter (HOSPITAL_COMMUNITY): Payer: Self-pay | Admitting: Emergency Medicine

## 2016-07-15 ENCOUNTER — Ambulatory Visit (HOSPITAL_COMMUNITY)
Admission: EM | Admit: 2016-07-15 | Discharge: 2016-07-15 | Disposition: A | Discharge status: Home / Self Care | Payer: Federal, State, Local not specified - PPO | Attending: Internal Medicine | Admitting: Internal Medicine

## 2016-07-15 DIAGNOSIS — H6691 Otitis media, unspecified, right ear: Secondary | ICD-10-CM | POA: Diagnosis not present

## 2016-07-15 DIAGNOSIS — R0981 Nasal congestion: Secondary | ICD-10-CM

## 2016-07-15 MED ORDER — AMOXICILLIN-POT CLAVULANATE 875-125 MG PO TABS: 1.0000 | ORAL_TABLET | Freq: Two times a day (BID) | ORAL | 0 refills | Status: AC

## 2016-07-15 MED ORDER — TRIAMCINOLONE ACETONIDE 55 MCG/ACT NA AERO: 2.0000 | INHALATION_SPRAY | Freq: Every day | NASAL | 0 refills | Status: DC

## 2016-07-15 MED ORDER — PREDNISONE 50 MG PO TABS: 50.0000 mg | ORAL_TABLET | Freq: Every day | ORAL | 0 refills | Status: DC

## 2016-07-15 NOTE — ED Triage Notes (Signed)
PT reports fullness in right ear, dizziness, decreased ability to hear in right ear. PT reports ringing in left ear. PT reports symptoms have been present for 2 weeks. PT also reports intermittent cough

## 2016-07-15 NOTE — Discharge Instructions (Addendum)
Anticipate gradual improvement in the severe head/ear congestion over the next several days.  Recheck for new fever >100.5, increasing phlegm production/nasal discharge, or if not starting to improve in a few days.

## 2016-07-15 NOTE — ED Provider Notes (Signed)
[facility]    CSN: [id number] Arrival date & time: 07/15/16  1044     History   Chief Complaint Chief Complaint  Patient presents with  . Ear Fullness    HPI Andre Stein is a 51 y.o. male. He presents today with a two-week history of increasing sinus congestion, postnasal drainage, productive cough. He is now having difficulty hearing, ringing in his ears, feels like he is under water. Feels a little bit off balance. Some tactile temperature.: Diarrhea. Headache. No nausea/vomiting. Many sick contacts.    HPI  Past Medical History:  Diagnosis Date  . Abdominal pain   . Bilateral inguinal hernia   . Chest pain   . GERD (gastroesophageal reflux disease)   . Hypertension   . Umbilical hernia    ventral    Patient Active Problem List   Diagnosis Date Noted  . Neck pain on right side 12/05/2014  . Allergic rhinitis due to allergen 09/26/2014  . Eczema, dyshidrotic 12/27/2013  . Pure hyperglyceridemia 10/30/2012  . Routine general medical examination at a health care facility 08/12/2011  . SLEEP APNEA 07/02/2010  . 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 Low back pain radiating to left leg 02/09/2010  . Essential hypertension 12/06/2009  . GERD 12/06/2009    Past Surgical History:  Procedure Laterality Date  . HERNIA REPAIR  10/29/11   BIH and lysis of adhesions  . INGUINAL HERNIA REPAIR     left   . UMBILICAL HERNIA REPAIR  10/29/11       Home Medications    Prior to Admission medications   Medication Sig Start Date End Date Taking? Authorizing Provider  amoxicillin-clavulanate (AUGMENTIN) 875-125 MG tablet Take 1 tablet by mouth 2 (two) times daily. 07/15/16 07/25/16  Eustace Moore, MD  Azilsartan Medoxomil (EDARBI) 40 MG TABS Take 1 tablet by mouth daily. 04/13/15   Etta Grandchild, MD  desloratadine (CLARINEX) 5 MG tablet Take 1 tablet (5 mg total) by mouth daily. 04/13/15   Etta Grandchild, MD  esomeprazole (NEXIUM) 40 MG capsule take 1  capsule by mouth once daily 06/27/15   Shon Baton, MD  predniSONE (DELTASONE) 50 MG tablet Take 1 tablet (50 mg total) by mouth daily. 07/15/16   Eustace Moore, MD  triamcinolone (NASACORT AQ) 55 MCG/ACT AERO nasal inhaler Place 2 sprays into the nose daily. 07/15/16   Eustace Moore, MD    Family History Family History  Problem Relation Age of Onset  . Heart disease Father   . Cancer Mother     pt believes it was colon  . Arthritis Other   . Diabetes Other   . Hypertension Other   . Stroke Other     Social History Social History  Substance Use Topics  . Smoking status: Never Smoker  . Smokeless tobacco: Never Used  . Alcohol use No     Allergies   Patient has no known allergies.   Review of Systems Review of Systems  All other systems reviewed and are negative.    Physical Exam Triage Vital Signs ED Triage Vitals  Enc Vitals Group     BP 07/15/16 1144 132/88     Pulse Rate 07/15/16 1144 69     Resp 07/15/16 1144 16     Temp 07/15/16 1144 98.3 F (36.8 C)     Temp Source 07/15/16 1144 Oral     SpO2 07/15/16 1144 97 %     Weight 07/15/16  1143 230 lb (104.3 kg)     Height 07/15/16 1143 5\' 11"  (1.803 m)     Pain Score 07/15/16 1145 6   Updated Vital Signs BP 132/88   Pulse 69   Temp 98.3 F (36.8 C) (Oral)   Resp 16   Ht 5\' 11"  (1.803 m)   Wt 230 lb (104.3 kg)   SpO2 97%   BMI 32.08 kg/m  Physical Exam  Constitutional: He is oriented to person, place, and time. No distress.  Alert, nicely groomed Voice sounds very congested  HENT:  Head: Atraumatic.  Right TM is dull, bulging, red Left TM is dull Marked nasal congestion bilaterally Throat is red  Eyes:  Conjugate gaze, no eye redness/drainage  Neck: Neck supple.  Cardiovascular: Normal rate and regular rhythm.   Pulmonary/Chest: No respiratory distress. He has no wheezes. He has no rales.  Lungs clear, symmetric breath sounds  Abdominal: He exhibits no distension.  Musculoskeletal:  Normal range of motion.  Neurological: He is alert and oriented to person, place, and time.  Skin: Skin is warm and dry.  No cyanosis  Nursing note and vitals reviewed.    UC Treatments / Results   Procedures Procedures (including critical care time) None today  Final Clinical Impressions(s) / UC Diagnoses   Final diagnoses:  Acute right otitis media  Sinus congestion   Anticipate gradual improvement in the severe head/ear congestion over the next several days.  Recheck for new fever >100.5, increasing phlegm production/nasal discharge, or if not starting to improve in a few days.  New Prescriptions New Prescriptions   AMOXICILLIN-CLAVULANATE (AUGMENTIN) 875-125 MG TABLET    Take 1 tablet by mouth 2 (two) times daily.   PREDNISONE (DELTASONE) 50 MG TABLET    Take 1 tablet (50 mg total) by mouth daily.   TRIAMCINOLONE (NASACORT AQ) 55 MCG/ACT AERO NASAL INHALER    Place 2 sprays into the nose daily.     Eustace MooreLaura W Deshawn Skelley, MD 07/16/16 2040

## 2016-12-17 DIAGNOSIS — K080 Exfoliation of teeth due to systemic causes: Secondary | ICD-10-CM | POA: Diagnosis not present

## 2017-02-13 DIAGNOSIS — L738 Other specified follicular disorders: Secondary | ICD-10-CM | POA: Diagnosis not present

## 2017-02-13 DIAGNOSIS — L308 Other specified dermatitis: Secondary | ICD-10-CM | POA: Diagnosis not present

## 2017-03-21 DIAGNOSIS — R5383 Other fatigue: Secondary | ICD-10-CM | POA: Diagnosis not present

## 2017-03-21 DIAGNOSIS — F5112 Insufficient sleep syndrome: Secondary | ICD-10-CM | POA: Diagnosis not present

## 2017-07-06 IMAGING — CR DG ABDOMEN ACUTE W/ 1V CHEST
3 series · 3 of 3 positions shown · non-contrast
Comparison: Chest radiograph performed 06/06/2013, and lumbar spine
radiographs performed 02/09/2010

CLINICAL DATA: Acute onset of upper abdominal pain and mid chest
pain. Initial encounter.

EXAM:
DG ABDOMEN ACUTE W/ 1V CHEST

[chest pa]
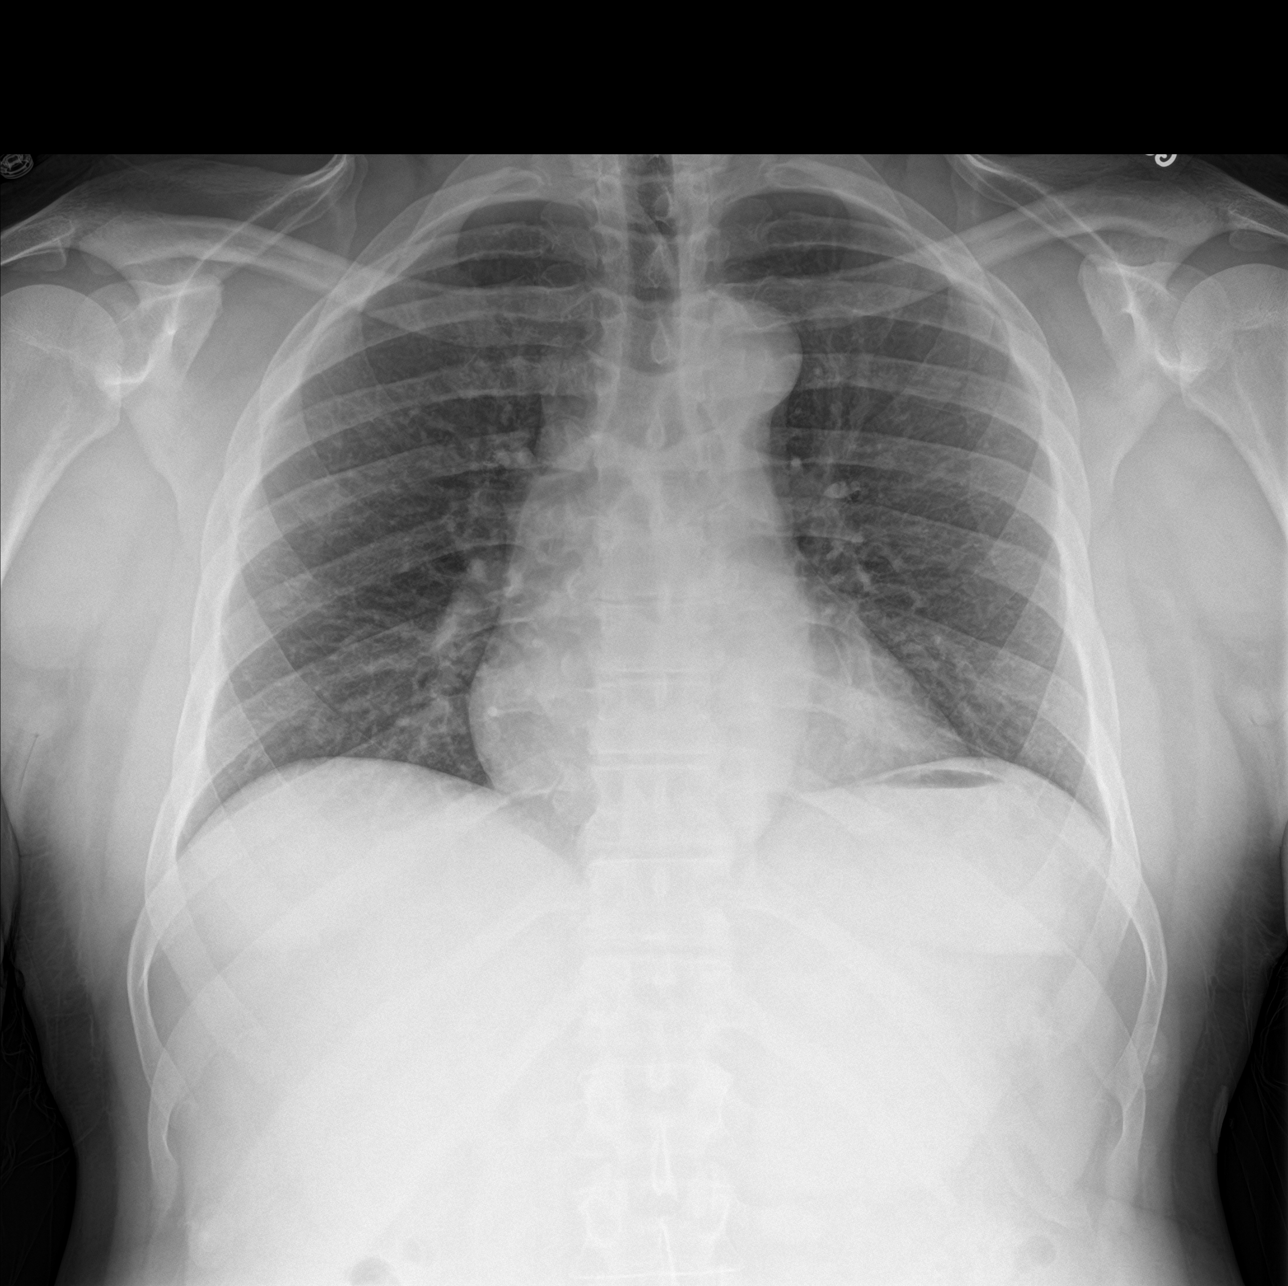

[abdomen erect]
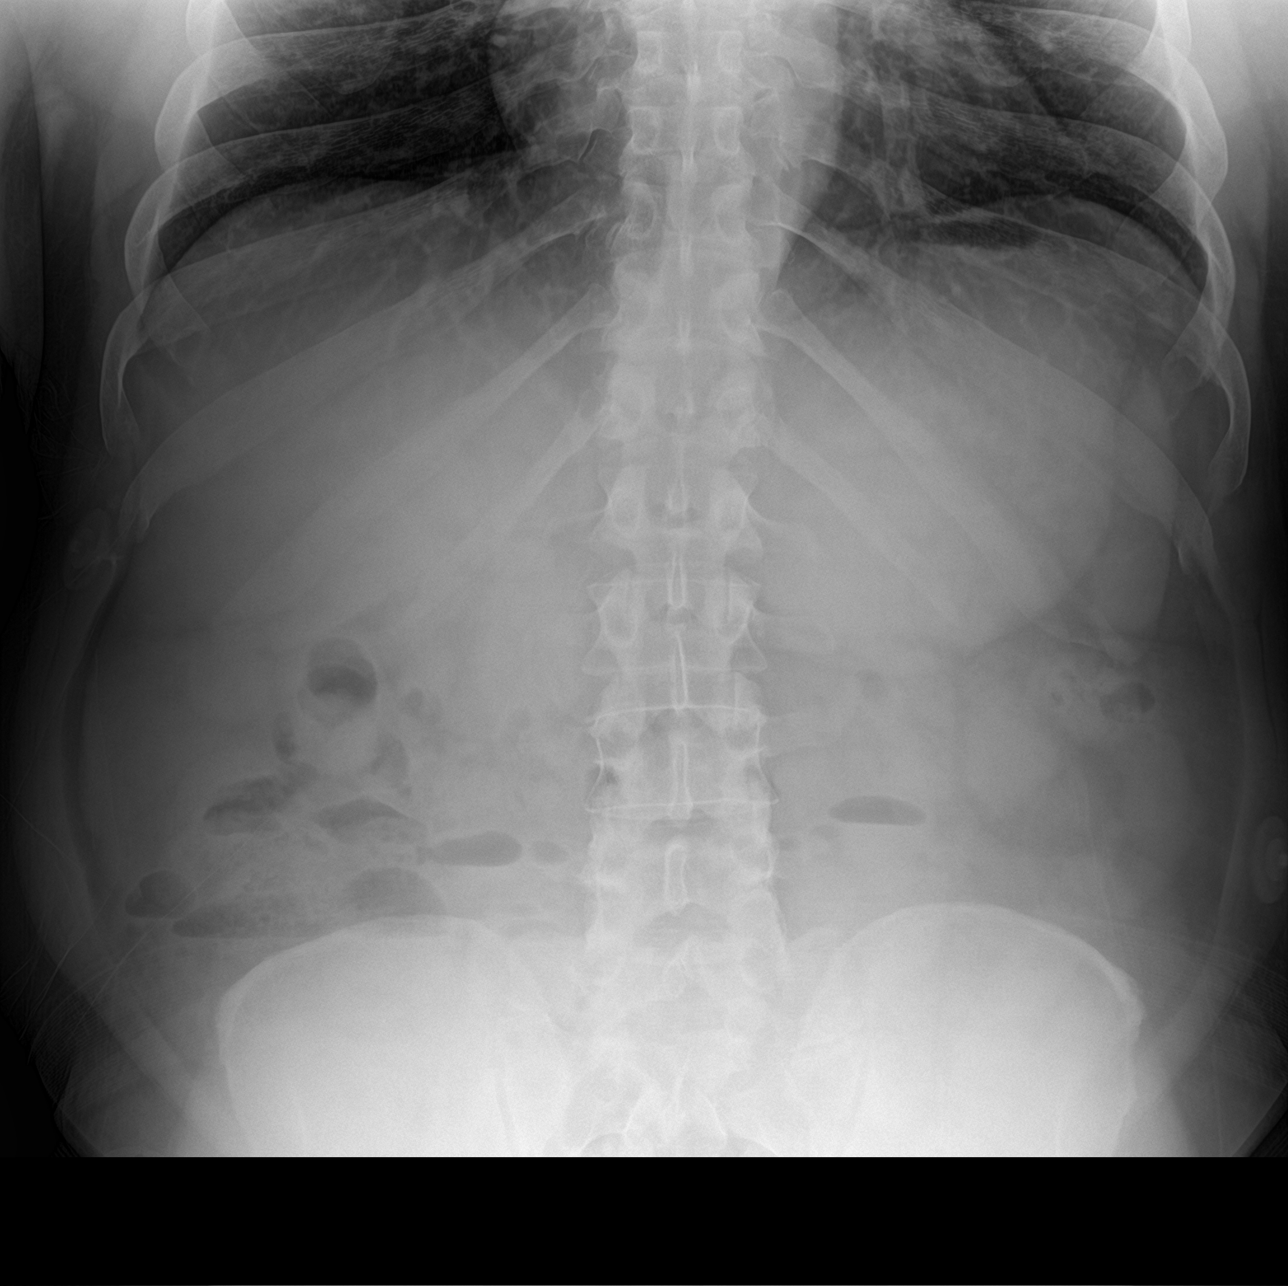

[abdomen supine]
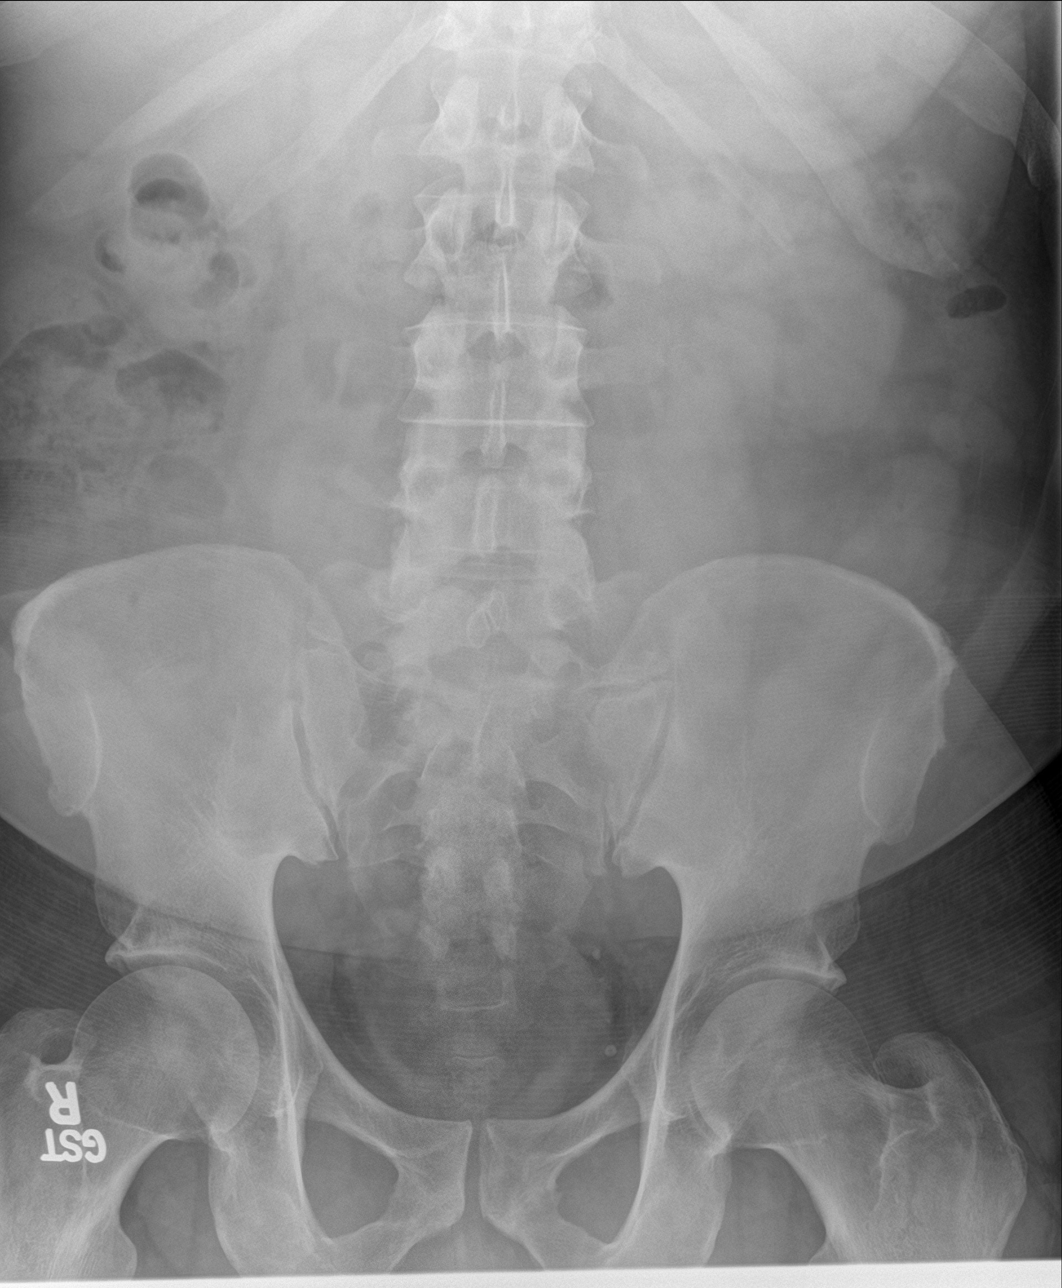

[3 of 3 positions shown; findings below may reference images not displayed]

FINDINGS: The lungs are well-aerated. Pulmonary vascularity is at the upper
limits of normal. There is no evidence of focal opacification,
pleural effusion or pneumothorax. The cardiomediastinal silhouette
is within normal limits.

The visualized bowel gas pattern is unremarkable. Scattered stool
and air are seen within the colon; there is no evidence of small
bowel dilatation to suggest obstruction. No free intra-abdominal air
is identified on the provided upright view.

No acute osseous abnormalities are seen; the sacroiliac joints are
unremarkable in appearance.
IMPRESSION: 1. Unremarkable bowel gas pattern; no free intra-abdominal air seen.
Small to moderate amount of stool noted in the colon.
2. No acute cardiopulmonary process seen.

## 2017-08-18 DIAGNOSIS — H35033 Hypertensive retinopathy, bilateral: Secondary | ICD-10-CM | POA: Diagnosis not present

## 2017-08-18 DIAGNOSIS — H2513 Age-related nuclear cataract, bilateral: Secondary | ICD-10-CM | POA: Diagnosis not present

## 2017-08-18 DIAGNOSIS — H47211 Primary optic atrophy, right eye: Secondary | ICD-10-CM | POA: Diagnosis not present

## 2017-08-18 DIAGNOSIS — I10 Essential (primary) hypertension: Secondary | ICD-10-CM | POA: Diagnosis not present

## 2017-08-18 DIAGNOSIS — H53021 Refractive amblyopia, right eye: Secondary | ICD-10-CM | POA: Diagnosis not present

## 2017-08-18 DIAGNOSIS — H04123 Dry eye syndrome of bilateral lacrimal glands: Secondary | ICD-10-CM | POA: Diagnosis not present

## 2017-08-18 DIAGNOSIS — H524 Presbyopia: Secondary | ICD-10-CM | POA: Diagnosis not present

## 2017-09-16 DIAGNOSIS — K219 Gastro-esophageal reflux disease without esophagitis: Secondary | ICD-10-CM | POA: Diagnosis not present

## 2017-09-16 DIAGNOSIS — Z1211 Encounter for screening for malignant neoplasm of colon: Secondary | ICD-10-CM | POA: Diagnosis not present

## 2017-10-24 DIAGNOSIS — D120 Benign neoplasm of cecum: Secondary | ICD-10-CM | POA: Diagnosis not present

## 2017-10-24 DIAGNOSIS — K635 Polyp of colon: Secondary | ICD-10-CM | POA: Diagnosis not present

## 2017-10-24 DIAGNOSIS — Z1211 Encounter for screening for malignant neoplasm of colon: Secondary | ICD-10-CM | POA: Diagnosis not present

## 2017-10-24 LAB — HM COLONOSCOPY

## 2017-12-06 DIAGNOSIS — R109 Unspecified abdominal pain: Secondary | ICD-10-CM | POA: Diagnosis not present

## 2020-01-10 DIAGNOSIS — H35033 Hypertensive retinopathy, bilateral: Secondary | ICD-10-CM | POA: Diagnosis not present

## 2020-07-19 ENCOUNTER — Ambulatory Visit
Admission: EM | Admit: 2020-07-19 | Discharge: 2020-07-19 | Disposition: A | Discharge status: Home / Self Care | Payer: Federal, State, Local not specified - PPO | Attending: Internal Medicine | Admitting: Internal Medicine

## 2020-07-19 ENCOUNTER — Other Ambulatory Visit: Payer: Self-pay

## 2020-07-19 DIAGNOSIS — Z1152 Encounter for screening for COVID-19: Secondary | ICD-10-CM

## 2020-07-19 NOTE — ED Triage Notes (Signed)
Patient presents to Urgent Care with complaints of fever (100.0), body aches x 1 day. Treating symptoms with Tylenol.

## 2020-07-19 NOTE — Discharge Instructions (Signed)
Your back pain is likely due to arthritis.  You can continue taking tylenol as needed.  You can also get something called Voltaren Gel over the counter at the pharmacy or grocery store.  You can use heat or ice topically as needed as well.    For your wrist pain you can try using wrist splints to immobilize your wrists when you sleep.  These are available at any pharmacy.    We will call you with the results of the covid test when we get them.  Please isolate from others until the results come back.

## 2020-07-19 NOTE — ED Provider Notes (Signed)
[facility]    CSN: [id number] Arrival date & time: 07/19/20  1254      History   Chief Complaint Chief Complaint  Patient presents with  . Fever  . Generalized Body Aches    HPI Andre Stein is a 55 y.o. male.   Back and hip pain that is chronic.  Pain was worse yesterday.  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 Over the past couple of months his pain has been progressing to his legs but not past his knees..  Sometimes he has to stop walking because of the pain.  Denies  numbness or tingling in his legs.  No incontinence.  Currently taking tylenol as needed.  Has used biofreeze occasionally.  He also has an occasional numbness/tingling in his hands bilaterally when using the forklift at work.    His wife had body aches and fever the past several days and this morning he woke up sweating.  He is otherwise asymptomatic.  He has been vaccinated x2 for covid.  He is not vaccinated against flu.        Past Medical History:  Diagnosis Date  . Abdominal pain   . Bilateral inguinal hernia   . Chest pain   . GERD (gastroesophageal reflux disease)   . Hypertension   . Umbilical hernia    ventral    Patient Active Problem List   Diagnosis Date Noted  . Neck pain on right side 12/05/2014  . Allergic rhinitis due to allergen 09/26/2014  . Eczema, dyshidrotic 12/27/2013  . Pure hyperglyceridemia 10/30/2012  . Routine general medical examination at a health care facility 08/12/2011  . SLEEP APNEA 07/02/2010  . 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 Low back pain radiating to left leg 02/09/2010  . Essential hypertension 12/06/2009  . GERD 12/06/2009    Past Surgical History:  Procedure Laterality Date  . HERNIA REPAIR  10/29/11   BIH and lysis of adhesions  . INGUINAL HERNIA REPAIR     left   . UMBILICAL HERNIA REPAIR  10/29/11       Home Medications    Prior to Admission medications   Medication Sig Start Date End Date Taking?  Authorizing Provider  Azilsartan Medoxomil (EDARBI) 40 MG TABS Take 1 tablet by mouth daily. 04/13/15   Janith Lima, MD  desloratadine (CLARINEX) 5 MG tablet Take 1 tablet (5 mg total) by mouth daily. 04/13/15   Janith Lima, MD  esomeprazole (NEXIUM) 40 MG capsule take 1 capsule by mouth once daily 06/27/15   Horton, Barbette Hair, MD  predniSONE (DELTASONE) 50 MG tablet Take 1 tablet (50 mg total) by mouth daily. 07/15/16   Wynona Luna, MD  triamcinolone (NASACORT AQ) 55 MCG/ACT AERO nasal inhaler Place 2 sprays into the nose daily. 07/15/16   Wynona Luna, MD    Family History Family History  Problem Relation Age of Onset  . Heart disease Father   . Cancer Mother        pt believes it was colon  . Arthritis Other   . Diabetes Other   . Hypertension Other   . Stroke Other     Social History Social History   Tobacco Use  . Smoking status: Never Smoker  . Smokeless tobacco: Never Used  Substance Use Topics  . Alcohol use: No  . Drug use: No     Allergies   Patient has no known allergies.   Review of Systems  Review of Systems  All other systems reviewed and are negative.    Physical Exam Triage Vital Signs ED Triage Vitals  Enc Vitals Group     BP 07/19/20 1515 (!) 139/96     Pulse Rate 07/19/20 1515 100     Resp 07/19/20 1515 16     Temp 07/19/20 1515 98.5 F (36.9 C)     Temp Source 07/19/20 1515 Oral     SpO2 07/19/20 1515 96 %     Weight 07/19/20 1514 230 lb (104.3 kg)     Height --      Head Circumference --      Peak Flow --      Pain Score 07/19/20 1514 0     Pain Loc --      Pain Edu? --      Excl. in GC? --    No data found.  Updated Vital Signs BP (!) 139/96 (BP Location: Right Arm)   Pulse 100   Temp 98.5 F (36.9 C) (Oral)   Resp 16   Wt 104.3 kg   SpO2 96%   BMI 32.08 kg/m   Visual Acuity Right Eye Distance:   Left Eye Distance:   Bilateral Distance:    Right Eye Near:   Left Eye Near:    Bilateral Near:      Physical Exam Vitals reviewed.  Constitutional:      General: He is not in acute distress.    Appearance: He is normal weight.  HENT:     Nose: Nose normal.     Mouth/Throat:     Mouth: Mucous membranes are moist.  Eyes:     Pupils: Pupils are equal, round, and reactive to light.  Cardiovascular:     Rate and Rhythm: Normal rate and regular rhythm.     Pulses: Normal pulses.     Heart sounds: Normal heart sounds.  Pulmonary:     Effort: Pulmonary effort is normal.     Breath sounds: Normal breath sounds.  Abdominal:     General: Abdomen is flat. Bowel sounds are normal.     Palpations: Abdomen is soft.     Tenderness: There is no abdominal tenderness.  Musculoskeletal:        General: Normal range of motion.     Cervical back: Normal range of motion.     Comments: Negative straight legged test.  FABER positive for posterior lumbosacral pain on the right.  No spinal/paraspinal tenderness.    Lymphadenopathy:     Cervical: No cervical adenopathy.  Neurological:     Mental Status: He is alert.      UC Treatments / Results  Labs (all labs ordered are listed, but only abnormal results are displayed) Labs Reviewed  NOVEL CORONAVIRUS, NAA    EKG   Radiology No results found.  Procedures Procedures (including critical care time)  Medications Ordered in UC Medications - No data to display  Initial Impression / Assessment and Plan / UC Course  I have reviewed the triage vital signs and the nursing notes.  Pertinent labs & imaging results that were available during my care of the patient were reviewed by me and considered in my medical decision making (see chart for details).     Pt has chronic, progressive back pain without sciatica.  Currently taking tylenol PRN.  Advised pt he can take OTC voltaren gel as needed and use heat/ice.   Pt also complains of b/l carpal tunnel like symptoms. 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  Advised pt to use OTC wrist  splints when sleeping at night and to follow up with his pcp.   Pt woke up sweating this morning but otherwise has been asymptomatic while his wife has been experiencing flu-like symptoms over the past 5 days.  Will test for covid.  Both are vaccinated without boosters for covid.  She is vaccinated against flu while he is not.  Advised to isolate from others until test returns negative.     Final Clinical Impressions(s) / UC Diagnoses   Final diagnoses:  Encounter for screening for COVID-19   Discharge Instructions   None    ED Prescriptions    None     PDMP not reviewed this encounter.   Sandre Kitty, MD 07/19/20 [phone]

## 2020-07-22 LAB — NOVEL CORONAVIRUS, NAA: SARS-CoV-2, NAA: DETECTED — AB

## 2020-08-02 ENCOUNTER — Other Ambulatory Visit: Payer: Self-pay

## 2020-08-02 ENCOUNTER — Emergency Department (HOSPITAL_COMMUNITY): Payer: Federal, State, Local not specified - PPO

## 2020-08-02 ENCOUNTER — Emergency Department (HOSPITAL_COMMUNITY)
Admission: EM | Admit: 2020-08-02 | Discharge: 2020-08-03 | Disposition: A | Discharge status: Home / Self Care | Payer: Federal, State, Local not specified - PPO | Attending: Emergency Medicine | Admitting: Emergency Medicine

## 2020-08-02 DIAGNOSIS — U071 COVID-19: Secondary | ICD-10-CM | POA: Insufficient documentation

## 2020-08-02 DIAGNOSIS — J189 Pneumonia, unspecified organism: Secondary | ICD-10-CM | POA: Diagnosis not present

## 2020-08-02 DIAGNOSIS — Z79899 Other long term (current) drug therapy: Secondary | ICD-10-CM | POA: Insufficient documentation

## 2020-08-02 DIAGNOSIS — I10 Essential (primary) hypertension: Secondary | ICD-10-CM | POA: Diagnosis not present

## 2020-08-02 DIAGNOSIS — R5383 Other fatigue: Secondary | ICD-10-CM | POA: Diagnosis not present

## 2020-08-02 DIAGNOSIS — R059 Cough, unspecified: Secondary | ICD-10-CM | POA: Diagnosis not present

## 2020-08-02 NOTE — ED Triage Notes (Signed)
Pt  Here with c/o of continued cough from covid day 10 , no fevers ,sats 98 % on room air

## 2020-08-03 MED ORDER — GUAIFENESIN-DM 100-10 MG/5ML PO SYRP: 5.0000 mL | ORAL_SOLUTION | Freq: Three times a day (TID) | ORAL | 0 refills | Status: DC | PRN

## 2020-08-03 MED ORDER — ONDANSETRON 4 MG PO TBDP: 4.0000 mg | ORAL_TABLET | Freq: Three times a day (TID) | ORAL | 0 refills | Status: DC | PRN

## 2020-08-03 NOTE — ED Provider Notes (Signed)
[facility] EMERGENCY DEPARTMENT Provider Note   CSN: [id number] Arrival date & time: 08/02/20  1643     History No chief complaint on file.   Andre Stein is a 55 y.o. male.  HPI Patient presents still feeling bad with COVID infection.  Day 10 of COVID.  States he has been coughing.  States there is some mild sputum production.  Not increased.  States at times he coughs till he vomits.  Dull chest pain at times.  No further fevers.  States he is not really feeling worse but not feeling better.  At times still chest pain.  States he has been taking Mucinex without relief at home.  States it almost feels up and make stuff worse.  Not really that short of breath.  No swelling in his legs.  Patient states his wife had similar symptoms and is feeling better although her COVID test was negative.    Past Medical History:  Diagnosis Date  . Abdominal pain   . Bilateral inguinal hernia   . Chest pain   . GERD (gastroesophageal reflux disease)   . Hypertension   . Umbilical hernia    ventral    Patient Active Problem List   Diagnosis Date Noted  . Neck pain on right side 12/05/2014  . Allergic rhinitis due to allergen 09/26/2014  . Eczema, dyshidrotic 12/27/2013  . Pure hyperglyceridemia 10/30/2012  . Routine general medical examination at a health care facility 08/12/2011  . SLEEP APNEA 07/02/2010  . 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 Low back pain radiating to left leg 02/09/2010  . Essential hypertension 12/06/2009  . GERD 12/06/2009    Past Surgical History:  Procedure Laterality Date  . HERNIA REPAIR  10/29/11   BIH and lysis of adhesions  . INGUINAL HERNIA REPAIR     left   . UMBILICAL HERNIA REPAIR  10/29/11       Family History  Problem Relation Age of Onset  . Heart disease Father   . Cancer Mother        pt believes it was colon  . Arthritis Other   . Diabetes Other   . Hypertension Other   . Stroke Other     Social  History   Tobacco Use  . Smoking status: Never Smoker  . Smokeless tobacco: Never Used  Substance Use Topics  . Alcohol use: No  . Drug use: No    Home Medications Prior to Admission medications   Medication Sig Start Date End Date Taking? Authorizing Provider  ondansetron (ZOFRAN-ODT) 4 MG disintegrating tablet Take 1 tablet (4 mg total) by mouth every 8 (eight) hours as needed for nausea or vomiting. 08/03/20  Yes Benjiman Core, MD  Azilsartan Medoxomil (EDARBI) 40 MG TABS Take 1 tablet by mouth daily. 04/13/15   Etta Grandchild, MD  desloratadine (CLARINEX) 5 MG tablet Take 1 tablet (5 mg total) by mouth daily. 04/13/15   Etta Grandchild, MD  esomeprazole (NEXIUM) 40 MG capsule take 1 capsule by mouth once daily 06/27/15   Horton, Mayer Masker, MD  guaiFENesin-dextromethorphan [facility] DM) 100-10 MG/5ML syrup Take 5 mLs by mouth 3 (three) times daily as needed for cough. 08/03/20   Benjiman Core, MD  triamcinolone (NASACORT AQ) 55 MCG/ACT AERO nasal inhaler Place 2 sprays into the nose daily. 07/15/16   Isa Rankin, MD    Allergies    Patient has no known allergies.  Review of Systems   Review of  Systems  Constitutional: Positive for appetite change.  HENT: Negative for congestion.   Respiratory: Positive for cough and shortness of breath.   Cardiovascular: Positive for chest pain.  Gastrointestinal: Positive for vomiting. Negative for abdominal pain.  Genitourinary: Negative for flank pain.  Musculoskeletal: Negative for back pain.  Skin: Negative for rash.  Neurological: Negative for weakness.  Psychiatric/Behavioral: Negative for confusion.    Physical Exam Updated Vital Signs BP (!) 115/97 (BP Location: Right Arm)   Pulse (!) 111   Temp 99.1 F (37.3 C) (Oral)   Resp 20   SpO2 97%   Physical Exam Vitals and nursing note reviewed.  HENT:     Head: Normocephalic.     Mouth/Throat:     Mouth: Mucous membranes are moist.  Eyes:     Pupils: Pupils  are equal, round, and reactive to light.  Cardiovascular:     Rate and Rhythm: Regular rhythm.  Pulmonary:     Comments: Mildly harsh breath sounds at the bases. Abdominal:     Tenderness: There is no abdominal tenderness.  Musculoskeletal:        General: No tenderness.     Cervical back: Neck supple.  Skin:    General: Skin is warm.     Capillary Refill: Capillary refill takes less than 2 seconds.  Neurological:     Mental Status: He is alert and oriented to person, place, and time.     ED Results / Procedures / Treatments   Labs (all labs ordered are listed, but only abnormal results are displayed) Labs Reviewed - No data to display  EKG None  Radiology DG Chest Portable 1 View  Result Date: 08/02/2020 CLINICAL DATA:  COVID fatigue EXAM: PORTABLE CHEST 1 VIEW COMPARISON:  06/06/2013 FINDINGS: Low lung volume. Minimal peripheral and basilar ground-glass opacity. Normal cardiomediastinal silhouette. No pneumothorax. IMPRESSION: Low lung volume with minimal peripheral and basilar ground-glass opacity, probable small foci of pneumonia. Electronically Signed   By: Jasmine Pang M.D.   On: 08/02/2020 19:39    Procedures Procedures (including critical care time)  Medications Ordered in ED Medications - No data to display  ED Course  I have reviewed the triage vital signs and the nursing notes.  Pertinent labs & imaging results that were available during my care of the patient were reviewed by me and considered in my medical decision making (see chart for details).    MDM Rules/Calculators/A&P                          Patient presented still feeling bad after having COVID.  States that he coughs and does produce some mild sputum.  States this is not increased recently.  Not having fevers anymore.  Not really feeling worse but not feeling better.  States he will occasionally cough to the point that he throws up.  Chest x-ray showed potential small pneumonia.  However I think  this is more likely a COVID-pneumonia as of opposed to a superimposed bacterial pneumonia.  Patient has not been feeling worse as I would expect him to if he developed a bacterial pneumonia on top of the COVID.  I think this is just progression of the same disease.  Not hypoxic with talking.  Even with talking heart rate does not increase.  Will discharge home with symptomatic treatment.  Outpatient follow-up as needed.  I have personally reviewed the chest x-ray.  Doubt cardiac cause of the chest tightness.  Doubt  pulmonary embolism Final Clinical Impression(s) / ED Diagnoses Final diagnoses:  COVID-19    Rx / DC Orders ED Discharge Orders         Ordered    guaiFENesin-dextromethorphan (ROBITUSSIN DM) 100-10 MG/5ML syrup  3 times daily PRN,   Status:  Discontinued        08/03/20 0830    ondansetron (ZOFRAN-ODT) 4 MG disintegrating tablet  Every 8 hours PRN        08/03/20 0830    guaiFENesin-dextromethorphan (ROBITUSSIN DM) 100-10 MG/5ML syrup  3 times daily PRN        08/03/20 0831           Benjiman Core, MD 08/03/20 [phone]

## 2020-08-15 ENCOUNTER — Ambulatory Visit: Payer: Federal, State, Local not specified - PPO | Admitting: Internal Medicine

## 2020-09-25 ENCOUNTER — Ambulatory Visit: Payer: Federal, State, Local not specified - PPO | Admitting: Internal Medicine

## 2020-09-25 ENCOUNTER — Other Ambulatory Visit: Payer: Self-pay

## 2020-09-25 ENCOUNTER — Encounter: Payer: Self-pay | Admitting: Internal Medicine

## 2020-09-25 VITALS — BP 164/104 | HR 74 | Temp 98.6°F | Resp 16 | Ht 71.0 in | Wt 237.0 lb

## 2020-09-25 DIAGNOSIS — I10 Essential (primary) hypertension: Secondary | ICD-10-CM | POA: Diagnosis not present

## 2020-09-25 DIAGNOSIS — M25552 Pain in left hip: Secondary | ICD-10-CM

## 2020-09-25 DIAGNOSIS — Z0000 Encounter for general adult medical examination without abnormal findings: Secondary | ICD-10-CM

## 2020-09-25 DIAGNOSIS — M25551 Pain in right hip: Secondary | ICD-10-CM | POA: Diagnosis not present

## 2020-09-25 DIAGNOSIS — R739 Hyperglycemia, unspecified: Secondary | ICD-10-CM

## 2020-09-25 DIAGNOSIS — Z125 Encounter for screening for malignant neoplasm of prostate: Secondary | ICD-10-CM

## 2020-09-25 DIAGNOSIS — R0683 Snoring: Secondary | ICD-10-CM | POA: Diagnosis not present

## 2020-09-25 DIAGNOSIS — G8929 Other chronic pain: Secondary | ICD-10-CM

## 2020-09-25 LAB — TSH: TSH: 2.03 u[IU]/mL (ref 0.35–4.50)

## 2020-09-25 LAB — CBC WITH DIFFERENTIAL/PLATELET
Basophils Absolute: 0 10*3/uL (ref 0.0–0.1)
Basophils Relative: 0.5 % (ref 0.0–3.0)
Eosinophils Absolute: 0.1 10*3/uL (ref 0.0–0.7)
Eosinophils Relative: 1.9 % (ref 0.0–5.0)
HCT: 40.9 % (ref 39.0–52.0)
Hemoglobin: 13.5 g/dL (ref 13.0–17.0)
Lymphocytes Relative: 20 % (ref 12.0–46.0)
Lymphs Abs: 0.9 10*3/uL (ref 0.7–4.0)
MCHC: 33 g/dL (ref 30.0–36.0)
MCV: 89.5 fl (ref 78.0–100.0)
Monocytes Absolute: 0.3 10*3/uL (ref 0.1–1.0)
Monocytes Relative: 7.4 % (ref 3.0–12.0)
Neutro Abs: 3.2 10*3/uL (ref 1.4–7.7)
Neutrophils Relative %: 70.2 % (ref 43.0–77.0)
Platelets: 185 10*3/uL (ref 150.0–400.0)
RBC: 4.57 Mil/uL (ref 4.22–5.81)
RDW: 14.2 % (ref 11.5–15.5)
WBC: 4.6 10*3/uL (ref 4.0–10.5)

## 2020-09-25 LAB — LIPID PANEL
Cholesterol: 174 mg/dL (ref 0–200)
HDL: 34.2 mg/dL — ABNORMAL LOW (ref >39.00)
NonHDL: 140.2
Total CHOL/HDL Ratio: 5
Triglycerides: 201 mg/dL — ABNORMAL HIGH (ref 0.0–149.0)
VLDL: 40.2 mg/dL — ABNORMAL HIGH (ref 0.0–40.0)

## 2020-09-25 LAB — URINALYSIS, ROUTINE W REFLEX MICROSCOPIC
Bilirubin Urine: NEGATIVE
Hgb urine dipstick: NEGATIVE
Ketones, ur: NEGATIVE
Leukocytes,Ua: NEGATIVE
Nitrite: NEGATIVE
RBC / HPF: NONE SEEN (ref 0–?)
Specific Gravity, Urine: >=1.03 — AB (ref 1.000–1.030)
Total Protein, Urine: NEGATIVE
Urine Glucose: NEGATIVE
Urobilinogen, UA: 0.2 (ref 0.0–1.0)
WBC, UA: NONE SEEN (ref 0–?)
pH: 5.5 (ref 5.0–8.0)

## 2020-09-25 LAB — HEPATIC FUNCTION PANEL
ALT: 29 U/L (ref 0–53)
AST: 26 U/L (ref 0–37)
Albumin: 4.2 g/dL (ref 3.5–5.2)
Alkaline Phosphatase: 60 U/L (ref 39–117)
Bilirubin, Direct: 0.1 mg/dL (ref 0.0–0.3)
Total Bilirubin: 0.5 mg/dL (ref 0.2–1.2)
Total Protein: 7.6 g/dL (ref 6.0–8.3)

## 2020-09-25 LAB — HEMOGLOBIN A1C: Hgb A1c MFr Bld: 5.3 % (ref 4.6–6.5)

## 2020-09-25 LAB — BASIC METABOLIC PANEL
BUN: 13 mg/dL (ref 6–23)
CO2: 29 mEq/L (ref 19–32)
Calcium: 9.6 mg/dL (ref 8.4–10.5)
Chloride: 106 mEq/L (ref 96–112)
Creatinine, Ser: 1.1 mg/dL (ref 0.40–1.50)
GFR: 75.2 mL/min (ref >60.00)
Glucose, Bld: 96 mg/dL (ref 70–99)
Potassium: 4.4 mEq/L (ref 3.5–5.1)
Sodium: 141 mEq/L (ref 135–145)

## 2020-09-25 LAB — LDL CHOLESTEROL, DIRECT: Direct LDL: 52 mg/dL

## 2020-09-25 LAB — PSA: PSA: 1.78 ng/mL (ref 0.10–4.00)

## 2020-09-25 NOTE — Progress Notes (Signed)
Subjective:  Patient ID: Andre Stein, male    DOB: 1966/05/14  Age: 55 y.o. MRN: [id number]  CC: Annual Exam, Hypertension, and Hyperlipidemia  This visit occurred during the SARS-CoV-2 public health emergency.  Safety protocols were in place, including screening questions prior to the visit, additional usage of staff PPE, and extensive cleaning of exam room while observing appropriate contact time as indicated for disinfecting solutions.    HPI Andre Stein presents for a CPX.  He complains of a 1 year history of nontraumatic pain in both hips.  He describes it as a deep aching sensation that increases with activity.  He is getting adequate symptom relief with the occasional dose of Tylenol and Aleve.  The pain occasionally radiates into his thighs.  He denies low back pain or lower extremity paresthesias.  He is active throughout the day and does not experience CP, DOE, palpitations, edema, or fatigue.  Outpatient Medications Prior to Visit  Medication Sig Dispense Refill   Azilsartan Medoxomil (EDARBI) 40 MG TABS Take 1 tablet by mouth daily. 90 tablet 1   desloratadine (CLARINEX) 5 MG tablet Take 1 tablet (5 mg total) by mouth daily. 90 tablet 3   esomeprazole (NEXIUM) 40 MG capsule take 1 capsule by mouth once daily 30 capsule 1   guaiFENesin-dextromethorphan (ROBITUSSIN DM) 100-10 MG/5ML syrup Take 5 mLs by mouth 3 (three) times daily as needed for cough. 118 mL 0   ondansetron (ZOFRAN-ODT) 4 MG disintegrating tablet Take 1 tablet (4 mg total) by mouth every 8 (eight) hours as needed for nausea or vomiting. 8 tablet 0   triamcinolone (NASACORT AQ) 55 MCG/ACT AERO nasal inhaler Place 2 sprays into the nose daily. 1 Inhaler 0   No facility-administered medications prior to visit.    ROS Review of Systems  Constitutional: Negative.  Negative for diaphoresis and fatigue.  HENT: Negative.   Respiratory: Negative for apnea, cough, chest tightness, shortness of breath  and wheezing.        His wife complains about his snoring  Cardiovascular: Negative for palpitations and leg swelling.  Gastrointestinal: Negative for abdominal pain, constipation, diarrhea, nausea and vomiting.  Genitourinary: Negative.  Negative for difficulty urinating, dysuria, hematuria, scrotal swelling and testicular pain.  Musculoskeletal: Positive for arthralgias. Negative for myalgias.  Neurological: Negative.  Negative for dizziness, weakness and numbness.  Hematological: Negative for adenopathy. Does not bruise/bleed easily.  Psychiatric/Behavioral: Negative.     Objective:  BP (!) 164/104    Pulse 74    Temp 98.6 F (37 C) (Oral)    Resp 16    Ht 5\' 11"  (1.803 m)    Wt 237 lb (107.5 kg)    SpO2 98%    BMI 33.05 kg/m   BP Readings from Last 3 Encounters:  09/25/20 (!) 164/104  08/03/20 (!) 115/97  07/19/20 (!) 139/96    Wt Readings from Last 3 Encounters:  09/25/20 237 lb (107.5 kg)  07/19/20 230 lb (104.3 kg)  07/15/16 230 lb (104.3 kg)    Physical Exam Vitals reviewed.  Constitutional:      Appearance: He is obese.  HENT:     Nose: Nose normal.     Mouth/Throat:     Mouth: Mucous membranes are moist.  Eyes:     General: No scleral icterus.    Conjunctiva/sclera: Conjunctivae normal.  Cardiovascular:     Rate and Rhythm: Normal rate and regular rhythm.     Heart sounds: No murmur heard.  Comments: EKG- Sinus arrhythmia, 67 bpm No LVH Normal EKG Pulmonary:     Effort: Pulmonary effort is normal.     Breath sounds: No stridor. No wheezing or rhonchi.  Abdominal:     General: Abdomen is protuberant. Bowel sounds are normal. There is no distension.     Palpations: Abdomen is soft. There is no hepatomegaly, splenomegaly or mass.     Tenderness: There is no abdominal tenderness.     Hernia: No hernia is present. There is no hernia in the left inguinal area or right inguinal area.  Genitourinary:    Pubic Area: No rash.      Penis: Normal and  circumcised.      Testes: Normal.     Epididymis:     Right: Normal.     Left: Normal.     Prostate: Normal. Not enlarged, not tender and no nodules present.     Rectum: Normal. Guaiac result negative. No mass, tenderness, anal fissure, external hemorrhoid or internal hemorrhoid. Normal anal tone.  Musculoskeletal:        General: Normal range of motion.     Cervical back: Neck supple.     Right hip: Normal.     Left hip: Normal.     Right lower leg: No edema.     Left lower leg: No edema.  Lymphadenopathy:     Cervical: No cervical adenopathy.     Lower Body: No right inguinal adenopathy. No left inguinal adenopathy.  Skin:    General: Skin is warm and dry.  Neurological:     General: No focal deficit present.     Mental Status: He is alert and oriented to person, place, and time. Mental status is at baseline.  Psychiatric:        Mood and Affect: Mood normal.        Behavior: Behavior normal.     Lab Results  Component Value Date   WBC 4.6 09/25/2020   HGB 13.5 09/25/2020   HCT 40.9 09/25/2020   PLT 185.0 09/25/2020   GLUCOSE 96 09/25/2020   CHOL 174 09/25/2020   TRIG 201.0 (H) 09/25/2020   HDL 34.20 (L) 09/25/2020   LDLDIRECT 52.0 09/25/2020   LDLCALC 95 10/30/2012   ALT 29 09/25/2020   AST 26 09/25/2020   NA 141 09/25/2020   K 4.4 09/25/2020   CL 106 09/25/2020   CREATININE 1.10 09/25/2020   BUN 13 09/25/2020   CO2 29 09/25/2020   TSH 2.03 09/25/2020   PSA 1.78 09/25/2020   HGBA1C 5.3 09/25/2020    DG Chest Portable 1 View  Result Date: 08/02/2020 CLINICAL DATA:  COVID fatigue EXAM: PORTABLE CHEST 1 VIEW COMPARISON:  06/06/2013 FINDINGS: Low lung volume. Minimal peripheral and basilar ground-glass opacity. Normal cardiomediastinal silhouette. No pneumothorax. IMPRESSION: Low lung volume with minimal peripheral and basilar ground-glass opacity, probable small foci of pneumonia. Electronically Signed   By: Andre Stein M.D.   On: 08/02/2020 19:39      Assessment & Plan:   Andre Stein was seen today for annual exam, hypertension and hyperlipidemia.  Diagnoses and all orders for this visit:  Essential hypertension- He has stage III hypertension.  I will screen for secondary causes and endorgan damage.  I have encouraged him to improve his lifestyle modifications.  Will start treating this with indapamide. -     CBC with Differential/Platelet; Future -     Basic metabolic panel; Future -     TSH; Future -  Urinalysis, Routine w reflex microscopic; Future -     Hepatic function panel; Future -     Aldosterone + renin activity w/ ratio; Future -     VITAMIN D 25 Hydroxy (Vit-D Deficiency, Fractures); Future -     EKG 12-Lead -     Aldosterone + renin activity w/ ratio -     Hepatic function panel -     Urinalysis, Routine w reflex microscopic -     TSH -     Basic metabolic panel -     CBC with Differential/Platelet -     indapamide (LOZOL) 1.25 MG tablet; Take 1 tablet (1.25 mg total) by mouth daily.  Routine general medical examination at a health care facility- Exam completed, labs reviewed - Statin therapy is not indicated since he has a low ASCVD risk score, he refused a flu vaccine, he tells me he had a screening colonoscopy years ago that was normal, patient education was given. -     Lipid panel; Future -     PSA; Future -     HIV Antibody (routine testing w rflx); Future -     Hepatitis C antibody; Future -     Hepatitis C antibody -     HIV Antibody (routine testing w rflx) -     PSA -     Lipid panel  Hyperglycemia- His blood sugar is normal now. -     Basic metabolic panel; Future -     Hemoglobin A1c; Future -     Hemoglobin A1c -     Basic metabolic panel  Chronic pain of both hips- I will check plain films to see if he has osteoarthritis. -     DG HIPS BILAT WITH PELVIS MIN 5 VIEWS; Future  Loud snoring -     Ambulatory referral to Sleep Studies  Other orders -     LDL cholesterol, direct   I have  discontinued Pollyann Samples "Ken"'s Azilsartan Medoxomil, desloratadine, esomeprazole, triamcinolone, ondansetron, and guaiFENesin-dextromethorphan. I am also having him start on indapamide.  Meds ordered this encounter  Medications   indapamide (LOZOL) 1.25 MG tablet    Sig: Take 1 tablet (1.25 mg total) by mouth daily.    Dispense:  90 tablet    Refill:  0     Follow-up: Return in about 2 months (around 11/25/2020).  Sanda Linger, MD

## 2020-09-25 NOTE — Patient Instructions (Signed)

## 2020-09-26 MED ORDER — INDAPAMIDE 1.25 MG PO TABS: 1.2500 mg | ORAL_TABLET | Freq: Every day | ORAL | 0 refills | Status: DC

## 2020-09-30 LAB — HIV ANTIBODY (ROUTINE TESTING W REFLEX): HIV 1&2 Ab, 4th Generation: NONREACTIVE

## 2020-09-30 LAB — HEPATITIS C ANTIBODY
Hepatitis C Ab: NONREACTIVE
SIGNAL TO CUT-OFF: 0.02 (ref <1.00)

## 2020-09-30 LAB — ALDOSTERONE + RENIN ACTIVITY W/ RATIO
ALDO / PRA Ratio: 2.3 Ratio (ref 0.9–28.9)
Aldosterone: 3 ng/dL
Renin Activity: 1.29 ng/mL/h (ref 0.25–5.82)

## 2021-05-07 DIAGNOSIS — M545 Low back pain, unspecified: Secondary | ICD-10-CM | POA: Diagnosis not present

## 2021-05-07 DIAGNOSIS — M25552 Pain in left hip: Secondary | ICD-10-CM | POA: Diagnosis not present

## 2021-05-07 DIAGNOSIS — M25551 Pain in right hip: Secondary | ICD-10-CM | POA: Diagnosis not present

## 2021-06-04 DIAGNOSIS — M545 Low back pain, unspecified: Secondary | ICD-10-CM | POA: Diagnosis not present

## 2021-06-15 ENCOUNTER — Other Ambulatory Visit: Payer: Self-pay | Admitting: Internal Medicine

## 2021-06-15 DIAGNOSIS — I10 Essential (primary) hypertension: Secondary | ICD-10-CM

## 2021-09-27 DIAGNOSIS — M545 Low back pain, unspecified: Secondary | ICD-10-CM | POA: Diagnosis not present

## 2021-09-27 DIAGNOSIS — M25562 Pain in left knee: Secondary | ICD-10-CM | POA: Diagnosis not present

## 2021-09-28 ENCOUNTER — Other Ambulatory Visit: Payer: Self-pay

## 2021-09-28 ENCOUNTER — Encounter (HOSPITAL_COMMUNITY): Payer: Self-pay

## 2021-09-28 ENCOUNTER — Emergency Department (HOSPITAL_COMMUNITY)
Admission: EM | Admit: 2021-09-28 | Discharge: 2021-09-28 | Disposition: A | Discharge status: Home / Self Care | Payer: Federal, State, Local not specified - PPO | Attending: Emergency Medicine | Admitting: Emergency Medicine

## 2021-09-28 DIAGNOSIS — M25562 Pain in left knee: Secondary | ICD-10-CM | POA: Diagnosis not present

## 2021-09-28 DIAGNOSIS — M79605 Pain in left leg: Secondary | ICD-10-CM | POA: Diagnosis not present

## 2021-09-28 DIAGNOSIS — M5432 Sciatica, left side: Secondary | ICD-10-CM | POA: Diagnosis not present

## 2021-09-28 DIAGNOSIS — M5442 Lumbago with sciatica, left side: Secondary | ICD-10-CM | POA: Diagnosis not present

## 2021-09-28 DIAGNOSIS — M25552 Pain in left hip: Secondary | ICD-10-CM | POA: Diagnosis not present

## 2021-09-28 DIAGNOSIS — I10 Essential (primary) hypertension: Secondary | ICD-10-CM | POA: Diagnosis not present

## 2021-09-28 NOTE — Discharge Instructions (Addendum)
Please follow-up with the orthopedic provider who gave you injections about your continued discomfort.  Your symptoms are consistent with sciatica. ? ?If you would like to follow-up with a different orthopedic provider, there is 1 attached to these papers. ?

## 2021-09-28 NOTE — ED Triage Notes (Addendum)
Pt BIB [facility] from home c/o increased sciatica pain. Pt states the pain runs from his left hip down his left leg. Pt was seen at his orthopedic and given steroids and shots yesterday but said the pain has increased. Pt states it is keeping him from getting around like he use to.  ?

## 2021-09-28 NOTE — ED Provider Notes (Signed)
?[facility] EMERGENCY DEPARTMENT ?Provider Note ? ? ?CSN: [id number] ?Arrival date & time: 09/28/21  1142 ? ?  ? ?History ? ?Chief Complaint  ?Patient presents with  ? back pain   ? ? ?Andre Stein is a 56 y.o. male presenting with a complaint of left knee pain.  Reports that he has history of sciatica and says that the pain in his knee is coming from his hip.  Was seen by orthopedics yesterday and given steroid shots and pain injections.  Also prescribed a pain medication however he cannot remember the name of it.  He reports that this discomfort has been coming and going for years however states he has never had any pain like this before.  He is concerned because he has seen multiple individuals have severe pain in 1 leg and then have a stroke.  Denies any weakness or sensation deficit. ? ? ?Home Medications ?Prior to Admission medications   ?Medication Sig Start Date End Date Taking? Authorizing Provider  ?indapamide (LOZOL) 1.25 MG tablet TAKE 1 TABLET(1.25 MG) BY MOUTH DAILY 06/16/21   Etta Grandchild, MD  ?   ? ?Allergies    ?Patient has no known allergies.   ? ?Review of Systems   ?Review of Systems ?See HPI ? ?Physical Exam ?Updated Vital Signs ?BP (!) 180/100 (BP Location: Right Arm)   Pulse 97   Temp 99.4 ?F (37.4 ?C) (Oral)   Resp 16   Ht 5\' 11"  (1.803 m)   Wt 104.3 kg   SpO2 99%   BMI 32.08 kg/m?  ?Physical Exam ?Vitals and nursing note reviewed.  ?Constitutional:   ?   Appearance: Normal appearance.  ?HENT:  ?   Head: Normocephalic and atraumatic.  ?Eyes:  ?   General: No scleral icterus. ?   Conjunctiva/sclera: Conjunctivae normal.  ?Pulmonary:  ?   Effort: Pulmonary effort is normal. No respiratory distress.  ?Musculoskeletal:     ?   General: No swelling, tenderness, deformity or signs of injury. Normal range of motion.  ?   Right lower leg: No edema.  ?   Left lower leg: No edema.  ?   Comments: Full range of motion of bilateral knees.  Sensation intact.  No obvious  swelling or injury.  Negative ligamental testing, straight leg positive.  ?Skin: ?   Findings: No rash.  ?   Comments: Band-Aids on the bilateral lower lumbosacral spine where his injections were given yesterday.  ?Neurological:  ?   Mental Status: He is alert.  ?Psychiatric:     ?   Mood and Affect: Mood normal.  ? ? ?ED Results / Procedures / Treatments   ?Labs ?(all labs ordered are listed, but only abnormal results are displayed) ?Labs Reviewed - No data to display ? ?EKG ?None ? ?Radiology ?No results found. ? ?Procedures ?Procedures  ? ?Medications Ordered in ED ?Medications - No data to display ? ?ED Course/ Medical Decision Making/ A&P ?Clinical Course as of 09/28/21 1526  ?Fri Sep 28, 2021  ?1524 I spoke with the patient and his wife at bedside.  Patient was very aggravated saying that he felt like he was being overlooked because no scans are being done.  I told him that I would be happy to order an x-ray however he already told me that it was negative last time and has no trauma since then.  He is very anxious about what happens if he goes home and falls.  He is also  frustrated that he would have to wait a long period of time if he were to fall at home.  I spoke with his wife on the phone and again at bedside who called to the patient and let him know that this is the incorrect place for nonemergent MRIs.  She voiced understanding and said that she is in the process of getting in touch with orthopedics for any further imaging. [MR]  ?  ?Clinical Course User Index ?[MR] Jeriyah Granlund A, PA-C  ? ?                        ?Medical Decision Making ? ?56 year old male presenting with pain to his left knee.  Was seen at orthopedics last week and had negative x-rays of the lumbar spine and left knee.  He was seen again by orthopedics yesterday to have steroid and pain injections.  He says the pain is not getting better after these shots yesterday. ? ?Physical exam: I performed a full neurologic exam due to  patient's concern about a stroke.  This was negative.  5 out of 5 strength in all extremities.  No sensation deficits.  Cranial nerves II through XII intact and EOMs and PERRLA.  Patient had full range of motion of the left knee.  Was able to walk however with a limp.  No bony tenderness. ? ?MDM/disposition: Patient reports having recent x-rays that were negative.  He said he felt as though I was not doing anything so more x-rays were offered however I explained to him that I do not believe these are going to give [place] any further information.  His wife walked into the room at the time of this discussion and agreed that no more imaging should be done unless it is an MRI.  I explained to both of them that this is not something that warrants emergent MRI and that they need to follow-up with the orthopedics office that they were already seen to have further imaging.  I also discussed with them the injections performed yesterday have not had enough time to reach their maximum.  Additionally, because he cannot tell me which medication he is on, I do not believe it is indicated for me to add more pain medications.  We discussed Tylenol and ibuprofen and he said that he was told not to take Tylenol with the medications that he was given because they already had Tylenol.  I suspect he was given Norco or Percocet, and I will not prescribe anything stronger stronger for his sciatica.  He will be discharged at this time.  He was originally resistant however his wife at bedside told him that there is nothing more to be done in the emergency department and convinced him to agree to discharge at this time. ? ? ?Final Clinical Impression(s) / ED Diagnoses ?Final diagnoses:  ?Sciatica of left side  ? ? ?Rx / DC Orders ?Results and diagnoses were explained to the patient and his wife. Return precautions discussed in full.  Today had no additional questions and expressed complete understanding. ? ? ?This chart was dictated using voice  recognition software.  Despite best efforts to proofread,  errors can occur which can change the documentation meaning.  ?  ?Saddie Benders, PA-C ?09/29/21 9169 ? ?  ?Terald Sleeper, MD ?09/29/21 1030 ? ?

## 2021-09-28 NOTE — ED Provider Triage Note (Signed)
Emergency Medicine Provider Triage Evaluation Note ? ?Andre Stein , a 56 y.o. male  was evaluated in triage.  Pt complains of right hip, knee pain. Hx of sciatica. Seen at orthopedics yesterday got injections, modification of pain medication. Was worried because had heard to associate unilateral pain with stroke. No hx of stroke. Some numbness of right knee, no new numbness/ weakness of other unilateral right limbs. No confusion, facial droop. Does not know what he's taking for pain. ? ?Review of Systems  ?Positive: Leg pain ?Negative: Facial droop, confusion, aphasia ? ?Physical Exam  ?BP (!) 180/100 (BP Location: Right Arm)   Pulse 97   Temp 99.4 ?F (37.4 ?C) (Oral)   Resp 16   Ht 5\' 11"  (1.803 m)   Wt 104.3 kg   SpO2 99%   BMI 32.08 kg/m?  ?Gen:   Awake, no distress   ?Resp:  Normal effort  ?MSK:   Moves right leg with some difficulty, decreased strength 2/2 pain. Normal ROM. ?Other:  Moves all 4 limbs spontaneously. No facial droop. No aphasia. ? ?Medical Decision Making  ?Medically screening exam initiated at 12:12 PM.  Appropriate orders placed.  [name] was informed that the remainder of the evaluation will be completed by another provider, this initial triage assessment does not replace that evaluation, and the importance of remaining in the ED until their evaluation is complete. ? ?  ?Anselmo Pickler, PA-C ?09/28/21 1214 ? ?

## 2021-10-04 DIAGNOSIS — M25552 Pain in left hip: Secondary | ICD-10-CM | POA: Diagnosis not present

## 2021-10-22 DIAGNOSIS — M9904 Segmental and somatic dysfunction of sacral region: Secondary | ICD-10-CM | POA: Diagnosis not present

## 2021-10-22 DIAGNOSIS — M9903 Segmental and somatic dysfunction of lumbar region: Secondary | ICD-10-CM | POA: Diagnosis not present

## 2021-10-22 DIAGNOSIS — M5416 Radiculopathy, lumbar region: Secondary | ICD-10-CM | POA: Diagnosis not present

## 2021-10-22 DIAGNOSIS — M9905 Segmental and somatic dysfunction of pelvic region: Secondary | ICD-10-CM | POA: Diagnosis not present

## 2021-10-24 DIAGNOSIS — M9903 Segmental and somatic dysfunction of lumbar region: Secondary | ICD-10-CM | POA: Diagnosis not present

## 2021-10-24 DIAGNOSIS — M9904 Segmental and somatic dysfunction of sacral region: Secondary | ICD-10-CM | POA: Diagnosis not present

## 2021-10-24 DIAGNOSIS — M9905 Segmental and somatic dysfunction of pelvic region: Secondary | ICD-10-CM | POA: Diagnosis not present

## 2021-10-24 DIAGNOSIS — M5126 Other intervertebral disc displacement, lumbar region: Secondary | ICD-10-CM | POA: Diagnosis not present

## 2021-10-24 DIAGNOSIS — M5116 Intervertebral disc disorders with radiculopathy, lumbar region: Secondary | ICD-10-CM | POA: Diagnosis not present

## 2021-10-24 DIAGNOSIS — M5416 Radiculopathy, lumbar region: Secondary | ICD-10-CM | POA: Diagnosis not present

## 2021-10-26 DIAGNOSIS — M5416 Radiculopathy, lumbar region: Secondary | ICD-10-CM | POA: Diagnosis not present

## 2021-10-26 DIAGNOSIS — M9904 Segmental and somatic dysfunction of sacral region: Secondary | ICD-10-CM | POA: Diagnosis not present

## 2021-10-26 DIAGNOSIS — M9905 Segmental and somatic dysfunction of pelvic region: Secondary | ICD-10-CM | POA: Diagnosis not present

## 2021-10-26 DIAGNOSIS — M9903 Segmental and somatic dysfunction of lumbar region: Secondary | ICD-10-CM | POA: Diagnosis not present

## 2021-10-29 DIAGNOSIS — M5416 Radiculopathy, lumbar region: Secondary | ICD-10-CM | POA: Diagnosis not present

## 2021-10-29 DIAGNOSIS — M9905 Segmental and somatic dysfunction of pelvic region: Secondary | ICD-10-CM | POA: Diagnosis not present

## 2021-10-29 DIAGNOSIS — M9904 Segmental and somatic dysfunction of sacral region: Secondary | ICD-10-CM | POA: Diagnosis not present

## 2021-10-29 DIAGNOSIS — M9903 Segmental and somatic dysfunction of lumbar region: Secondary | ICD-10-CM | POA: Diagnosis not present

## 2021-10-31 DIAGNOSIS — M9903 Segmental and somatic dysfunction of lumbar region: Secondary | ICD-10-CM | POA: Diagnosis not present

## 2021-10-31 DIAGNOSIS — M9905 Segmental and somatic dysfunction of pelvic region: Secondary | ICD-10-CM | POA: Diagnosis not present

## 2021-10-31 DIAGNOSIS — M5416 Radiculopathy, lumbar region: Secondary | ICD-10-CM | POA: Diagnosis not present

## 2021-10-31 DIAGNOSIS — M9904 Segmental and somatic dysfunction of sacral region: Secondary | ICD-10-CM | POA: Diagnosis not present

## 2021-11-02 DIAGNOSIS — M9904 Segmental and somatic dysfunction of sacral region: Secondary | ICD-10-CM | POA: Diagnosis not present

## 2021-11-02 DIAGNOSIS — M5416 Radiculopathy, lumbar region: Secondary | ICD-10-CM | POA: Diagnosis not present

## 2021-11-02 DIAGNOSIS — M9903 Segmental and somatic dysfunction of lumbar region: Secondary | ICD-10-CM | POA: Diagnosis not present

## 2021-11-02 DIAGNOSIS — M9905 Segmental and somatic dysfunction of pelvic region: Secondary | ICD-10-CM | POA: Diagnosis not present

## 2021-11-05 DIAGNOSIS — M4316 Spondylolisthesis, lumbar region: Secondary | ICD-10-CM | POA: Diagnosis not present

## 2021-11-05 DIAGNOSIS — Z6832 Body mass index (BMI) 32.0-32.9, adult: Secondary | ICD-10-CM | POA: Diagnosis not present

## 2021-11-05 DIAGNOSIS — M9903 Segmental and somatic dysfunction of lumbar region: Secondary | ICD-10-CM | POA: Diagnosis not present

## 2021-11-05 DIAGNOSIS — M9905 Segmental and somatic dysfunction of pelvic region: Secondary | ICD-10-CM | POA: Diagnosis not present

## 2021-11-05 DIAGNOSIS — M9904 Segmental and somatic dysfunction of sacral region: Secondary | ICD-10-CM | POA: Diagnosis not present

## 2021-11-05 DIAGNOSIS — M5416 Radiculopathy, lumbar region: Secondary | ICD-10-CM | POA: Diagnosis not present

## 2021-11-07 DIAGNOSIS — M5116 Intervertebral disc disorders with radiculopathy, lumbar region: Secondary | ICD-10-CM | POA: Diagnosis not present

## 2021-11-07 DIAGNOSIS — M5416 Radiculopathy, lumbar region: Secondary | ICD-10-CM | POA: Diagnosis not present

## 2021-11-07 DIAGNOSIS — M9904 Segmental and somatic dysfunction of sacral region: Secondary | ICD-10-CM | POA: Diagnosis not present

## 2021-11-07 DIAGNOSIS — M9905 Segmental and somatic dysfunction of pelvic region: Secondary | ICD-10-CM | POA: Diagnosis not present

## 2021-11-07 DIAGNOSIS — M9903 Segmental and somatic dysfunction of lumbar region: Secondary | ICD-10-CM | POA: Diagnosis not present

## 2021-11-07 DIAGNOSIS — M5126 Other intervertebral disc displacement, lumbar region: Secondary | ICD-10-CM | POA: Diagnosis not present

## 2021-11-09 DIAGNOSIS — M9904 Segmental and somatic dysfunction of sacral region: Secondary | ICD-10-CM | POA: Diagnosis not present

## 2021-11-09 DIAGNOSIS — M9905 Segmental and somatic dysfunction of pelvic region: Secondary | ICD-10-CM | POA: Diagnosis not present

## 2021-11-09 DIAGNOSIS — M5416 Radiculopathy, lumbar region: Secondary | ICD-10-CM | POA: Diagnosis not present

## 2021-11-09 DIAGNOSIS — M9903 Segmental and somatic dysfunction of lumbar region: Secondary | ICD-10-CM | POA: Diagnosis not present

## 2021-11-12 DIAGNOSIS — M5416 Radiculopathy, lumbar region: Secondary | ICD-10-CM | POA: Diagnosis not present

## 2021-11-12 DIAGNOSIS — M9903 Segmental and somatic dysfunction of lumbar region: Secondary | ICD-10-CM | POA: Diagnosis not present

## 2021-11-12 DIAGNOSIS — M9904 Segmental and somatic dysfunction of sacral region: Secondary | ICD-10-CM | POA: Diagnosis not present

## 2021-11-12 DIAGNOSIS — M9905 Segmental and somatic dysfunction of pelvic region: Secondary | ICD-10-CM | POA: Diagnosis not present

## 2021-11-14 DIAGNOSIS — M5116 Intervertebral disc disorders with radiculopathy, lumbar region: Secondary | ICD-10-CM | POA: Diagnosis not present

## 2021-11-21 DIAGNOSIS — M5416 Radiculopathy, lumbar region: Secondary | ICD-10-CM | POA: Diagnosis not present

## 2021-11-21 DIAGNOSIS — M9903 Segmental and somatic dysfunction of lumbar region: Secondary | ICD-10-CM | POA: Diagnosis not present

## 2021-11-21 DIAGNOSIS — M9904 Segmental and somatic dysfunction of sacral region: Secondary | ICD-10-CM | POA: Diagnosis not present

## 2021-11-21 DIAGNOSIS — M9905 Segmental and somatic dysfunction of pelvic region: Secondary | ICD-10-CM | POA: Diagnosis not present

## 2021-11-23 DIAGNOSIS — M9904 Segmental and somatic dysfunction of sacral region: Secondary | ICD-10-CM | POA: Diagnosis not present

## 2021-11-23 DIAGNOSIS — M9905 Segmental and somatic dysfunction of pelvic region: Secondary | ICD-10-CM | POA: Diagnosis not present

## 2021-11-23 DIAGNOSIS — M5416 Radiculopathy, lumbar region: Secondary | ICD-10-CM | POA: Diagnosis not present

## 2021-11-23 DIAGNOSIS — M9903 Segmental and somatic dysfunction of lumbar region: Secondary | ICD-10-CM | POA: Diagnosis not present

## 2021-11-26 DIAGNOSIS — M9903 Segmental and somatic dysfunction of lumbar region: Secondary | ICD-10-CM | POA: Diagnosis not present

## 2021-11-26 DIAGNOSIS — M9904 Segmental and somatic dysfunction of sacral region: Secondary | ICD-10-CM | POA: Diagnosis not present

## 2021-11-26 DIAGNOSIS — M5416 Radiculopathy, lumbar region: Secondary | ICD-10-CM | POA: Diagnosis not present

## 2021-11-26 DIAGNOSIS — M9905 Segmental and somatic dysfunction of pelvic region: Secondary | ICD-10-CM | POA: Diagnosis not present

## 2021-11-28 DIAGNOSIS — M9904 Segmental and somatic dysfunction of sacral region: Secondary | ICD-10-CM | POA: Diagnosis not present

## 2021-11-28 DIAGNOSIS — M5416 Radiculopathy, lumbar region: Secondary | ICD-10-CM | POA: Diagnosis not present

## 2021-11-28 DIAGNOSIS — M9905 Segmental and somatic dysfunction of pelvic region: Secondary | ICD-10-CM | POA: Diagnosis not present

## 2021-11-28 DIAGNOSIS — M9903 Segmental and somatic dysfunction of lumbar region: Secondary | ICD-10-CM | POA: Diagnosis not present

## 2021-11-29 DIAGNOSIS — M5126 Other intervertebral disc displacement, lumbar region: Secondary | ICD-10-CM | POA: Diagnosis not present

## 2021-11-29 DIAGNOSIS — M545 Low back pain, unspecified: Secondary | ICD-10-CM | POA: Diagnosis not present

## 2021-11-29 DIAGNOSIS — M5116 Intervertebral disc disorders with radiculopathy, lumbar region: Secondary | ICD-10-CM | POA: Diagnosis not present

## 2021-11-30 DIAGNOSIS — M9904 Segmental and somatic dysfunction of sacral region: Secondary | ICD-10-CM | POA: Diagnosis not present

## 2021-11-30 DIAGNOSIS — M9903 Segmental and somatic dysfunction of lumbar region: Secondary | ICD-10-CM | POA: Diagnosis not present

## 2021-11-30 DIAGNOSIS — M9905 Segmental and somatic dysfunction of pelvic region: Secondary | ICD-10-CM | POA: Diagnosis not present

## 2021-11-30 DIAGNOSIS — M5416 Radiculopathy, lumbar region: Secondary | ICD-10-CM | POA: Diagnosis not present

## 2021-12-03 DIAGNOSIS — M9904 Segmental and somatic dysfunction of sacral region: Secondary | ICD-10-CM | POA: Diagnosis not present

## 2021-12-03 DIAGNOSIS — M9903 Segmental and somatic dysfunction of lumbar region: Secondary | ICD-10-CM | POA: Diagnosis not present

## 2021-12-03 DIAGNOSIS — M5416 Radiculopathy, lumbar region: Secondary | ICD-10-CM | POA: Diagnosis not present

## 2021-12-03 DIAGNOSIS — M9905 Segmental and somatic dysfunction of pelvic region: Secondary | ICD-10-CM | POA: Diagnosis not present

## 2021-12-05 DIAGNOSIS — M9903 Segmental and somatic dysfunction of lumbar region: Secondary | ICD-10-CM | POA: Diagnosis not present

## 2021-12-05 DIAGNOSIS — M9904 Segmental and somatic dysfunction of sacral region: Secondary | ICD-10-CM | POA: Diagnosis not present

## 2021-12-05 DIAGNOSIS — M5416 Radiculopathy, lumbar region: Secondary | ICD-10-CM | POA: Diagnosis not present

## 2021-12-05 DIAGNOSIS — M9905 Segmental and somatic dysfunction of pelvic region: Secondary | ICD-10-CM | POA: Diagnosis not present

## 2021-12-11 DIAGNOSIS — M9905 Segmental and somatic dysfunction of pelvic region: Secondary | ICD-10-CM | POA: Diagnosis not present

## 2021-12-11 DIAGNOSIS — M5416 Radiculopathy, lumbar region: Secondary | ICD-10-CM | POA: Diagnosis not present

## 2021-12-11 DIAGNOSIS — M9903 Segmental and somatic dysfunction of lumbar region: Secondary | ICD-10-CM | POA: Diagnosis not present

## 2021-12-11 DIAGNOSIS — M9904 Segmental and somatic dysfunction of sacral region: Secondary | ICD-10-CM | POA: Diagnosis not present

## 2021-12-12 DIAGNOSIS — M9904 Segmental and somatic dysfunction of sacral region: Secondary | ICD-10-CM | POA: Diagnosis not present

## 2021-12-12 DIAGNOSIS — M9903 Segmental and somatic dysfunction of lumbar region: Secondary | ICD-10-CM | POA: Diagnosis not present

## 2021-12-12 DIAGNOSIS — M5416 Radiculopathy, lumbar region: Secondary | ICD-10-CM | POA: Diagnosis not present

## 2021-12-12 DIAGNOSIS — M9905 Segmental and somatic dysfunction of pelvic region: Secondary | ICD-10-CM | POA: Diagnosis not present

## 2021-12-17 DIAGNOSIS — M9905 Segmental and somatic dysfunction of pelvic region: Secondary | ICD-10-CM | POA: Diagnosis not present

## 2021-12-17 DIAGNOSIS — M9903 Segmental and somatic dysfunction of lumbar region: Secondary | ICD-10-CM | POA: Diagnosis not present

## 2021-12-17 DIAGNOSIS — M5416 Radiculopathy, lumbar region: Secondary | ICD-10-CM | POA: Diagnosis not present

## 2021-12-17 DIAGNOSIS — M9904 Segmental and somatic dysfunction of sacral region: Secondary | ICD-10-CM | POA: Diagnosis not present

## 2021-12-26 DIAGNOSIS — M9905 Segmental and somatic dysfunction of pelvic region: Secondary | ICD-10-CM | POA: Diagnosis not present

## 2021-12-26 DIAGNOSIS — M5416 Radiculopathy, lumbar region: Secondary | ICD-10-CM | POA: Diagnosis not present

## 2021-12-26 DIAGNOSIS — M9903 Segmental and somatic dysfunction of lumbar region: Secondary | ICD-10-CM | POA: Diagnosis not present

## 2021-12-26 DIAGNOSIS — M9904 Segmental and somatic dysfunction of sacral region: Secondary | ICD-10-CM | POA: Diagnosis not present

## 2022-01-02 DIAGNOSIS — M5416 Radiculopathy, lumbar region: Secondary | ICD-10-CM | POA: Diagnosis not present

## 2022-01-02 DIAGNOSIS — M9904 Segmental and somatic dysfunction of sacral region: Secondary | ICD-10-CM | POA: Diagnosis not present

## 2022-01-02 DIAGNOSIS — M9905 Segmental and somatic dysfunction of pelvic region: Secondary | ICD-10-CM | POA: Diagnosis not present

## 2022-01-02 DIAGNOSIS — M9903 Segmental and somatic dysfunction of lumbar region: Secondary | ICD-10-CM | POA: Diagnosis not present

## 2022-01-09 DIAGNOSIS — M9905 Segmental and somatic dysfunction of pelvic region: Secondary | ICD-10-CM | POA: Diagnosis not present

## 2022-01-09 DIAGNOSIS — M5416 Radiculopathy, lumbar region: Secondary | ICD-10-CM | POA: Diagnosis not present

## 2022-01-09 DIAGNOSIS — M9904 Segmental and somatic dysfunction of sacral region: Secondary | ICD-10-CM | POA: Diagnosis not present

## 2022-01-09 DIAGNOSIS — M9903 Segmental and somatic dysfunction of lumbar region: Secondary | ICD-10-CM | POA: Diagnosis not present

## 2022-01-12 DIAGNOSIS — M5116 Intervertebral disc disorders with radiculopathy, lumbar region: Secondary | ICD-10-CM | POA: Diagnosis not present

## 2022-01-30 DIAGNOSIS — M5416 Radiculopathy, lumbar region: Secondary | ICD-10-CM | POA: Diagnosis not present

## 2022-02-24 ENCOUNTER — Other Ambulatory Visit: Payer: Self-pay | Admitting: Internal Medicine

## 2022-02-24 DIAGNOSIS — I10 Essential (primary) hypertension: Secondary | ICD-10-CM

## 2022-02-28 ENCOUNTER — Encounter: Payer: Self-pay | Admitting: Emergency Medicine

## 2022-02-28 ENCOUNTER — Ambulatory Visit: Payer: Federal, State, Local not specified - PPO | Admitting: Emergency Medicine

## 2022-02-28 ENCOUNTER — Other Ambulatory Visit: Payer: Self-pay | Admitting: Emergency Medicine

## 2022-02-28 VITALS — BP 160/100 | HR 74 | Temp 98.3°F | Ht 71.0 in | Wt 241.4 lb

## 2022-02-28 DIAGNOSIS — E785 Hyperlipidemia, unspecified: Secondary | ICD-10-CM

## 2022-02-28 DIAGNOSIS — I10 Essential (primary) hypertension: Secondary | ICD-10-CM

## 2022-02-28 LAB — LDL CHOLESTEROL, DIRECT: Direct LDL: 53 mg/dL

## 2022-02-28 LAB — COMPREHENSIVE METABOLIC PANEL
ALT: 25 U/L (ref 0–53)
AST: 21 U/L (ref 0–37)
Albumin: 4.3 g/dL (ref 3.5–5.2)
Alkaline Phosphatase: 64 U/L (ref 39–117)
BUN: 19 mg/dL (ref 6–23)
CO2: 29 mEq/L (ref 19–32)
Calcium: 9.6 mg/dL (ref 8.4–10.5)
Chloride: 104 mEq/L (ref 96–112)
Creatinine, Ser: 1.2 mg/dL (ref 0.40–1.50)
GFR: 67.07 mL/min (ref >60.00)
Glucose, Bld: 98 mg/dL (ref 70–99)
Potassium: 4.1 mEq/L (ref 3.5–5.1)
Sodium: 141 mEq/L (ref 135–145)
Total Bilirubin: 0.6 mg/dL (ref 0.2–1.2)
Total Protein: 7.3 g/dL (ref 6.0–8.3)

## 2022-02-28 LAB — CBC WITH DIFFERENTIAL/PLATELET
Basophils Absolute: 0 10*3/uL (ref 0.0–0.1)
Basophils Relative: 0.8 % (ref 0.0–3.0)
Eosinophils Absolute: 0.2 10*3/uL (ref 0.0–0.7)
Eosinophils Relative: 3.1 % (ref 0.0–5.0)
HCT: 40.2 % (ref 39.0–52.0)
Hemoglobin: 13.3 g/dL (ref 13.0–17.0)
Lymphocytes Relative: 21.8 % (ref 12.0–46.0)
Lymphs Abs: 1.2 10*3/uL (ref 0.7–4.0)
MCHC: 33 g/dL (ref 30.0–36.0)
MCV: 90.2 fl (ref 78.0–100.0)
Monocytes Absolute: 0.5 10*3/uL (ref 0.1–1.0)
Monocytes Relative: 8 % (ref 3.0–12.0)
Neutro Abs: 3.8 10*3/uL (ref 1.4–7.7)
Neutrophils Relative %: 66.3 % (ref 43.0–77.0)
Platelets: 193 10*3/uL (ref 150.0–400.0)
RBC: 4.46 Mil/uL (ref 4.22–5.81)
RDW: 13.9 % (ref 11.5–15.5)
WBC: 5.7 10*3/uL (ref 4.0–10.5)

## 2022-02-28 LAB — LIPID PANEL
Cholesterol: 217 mg/dL — ABNORMAL HIGH (ref 0–200)
HDL: 34.7 mg/dL — ABNORMAL LOW (ref >39.00)
NonHDL: 182.52
Total CHOL/HDL Ratio: 6
Triglycerides: 292 mg/dL — ABNORMAL HIGH (ref 0.0–149.0)
VLDL: 58.4 mg/dL — ABNORMAL HIGH (ref 0.0–40.0)

## 2022-02-28 LAB — HEMOGLOBIN A1C: Hgb A1c MFr Bld: 6 % (ref 4.6–6.5)

## 2022-02-28 MED ORDER — INDAPAMIDE 1.25 MG PO TABS: 1.2500 mg | ORAL_TABLET | Freq: Every day | ORAL | 0 refills | Status: DC

## 2022-02-28 MED ORDER — ROSUVASTATIN CALCIUM 10 MG PO TABS: 10.0000 mg | ORAL_TABLET | Freq: Every day | ORAL | 3 refills | Status: DC

## 2022-02-28 NOTE — Assessment & Plan Note (Signed)
Uncontrolled hypertension. Cardiovascular risks associated with uncontrolled hypertension discussed. Dietary approaches to stop hypertension discussed. Restart indapamide 1.25 mg daily. Patient has appointment to see Dr. Yetta Barre in 2 weeks. Blood work done today.

## 2022-02-28 NOTE — Patient Instructions (Signed)
Hypertension, Adult High blood pressure (hypertension) is when the force of blood pumping through the arteries is too strong. The arteries are the blood vessels that carry blood from the heart throughout the body. Hypertension forces the heart to work harder to pump blood and may cause arteries to become narrow or stiff. Untreated or uncontrolled hypertension can lead to a heart attack, heart failure, a stroke, kidney disease, and other problems. A blood pressure reading consists of a higher number over a lower number. Ideally, your blood pressure should be below 120/80. The first ("top") number is called the systolic pressure. It is a measure of the pressure in your arteries as your heart beats. The second ("bottom") number is called the diastolic pressure. It is a measure of the pressure in your arteries as the heart relaxes. What are the causes? The exact cause of this condition is not known. There are some conditions that result in high blood pressure. What increases the risk? Certain factors may make you more likely to develop high blood pressure. Some of these risk factors are under your control, including: Smoking. Not getting enough exercise or physical activity. Being overweight. Having too much fat, sugar, calories, or salt (sodium) in your diet. Drinking too much alcohol. Other risk factors include: Having a personal history of heart disease, diabetes, high cholesterol, or kidney disease. Stress. Having a family history of high blood pressure and high cholesterol. Having obstructive sleep apnea. Age. The risk increases with age. What are the signs or symptoms? High blood pressure may not cause symptoms. Very high blood pressure (hypertensive crisis) may cause: Headache. Fast or irregular heartbeats (palpitations). Shortness of breath. Nosebleed. Nausea and vomiting. Vision changes. Severe chest pain, dizziness, and seizures. How is this diagnosed? This condition is diagnosed by  measuring your blood pressure while you are seated, with your arm resting on a flat surface, your legs uncrossed, and your feet flat on the floor. The cuff of the blood pressure monitor will be placed directly against the skin of your upper arm at the level of your heart. Blood pressure should be measured at least twice using the same arm. Certain conditions can cause a difference in blood pressure between your right and left arms. If you have a high blood pressure reading during one visit or you have normal blood pressure with other risk factors, you may be asked to: Return on a different day to have your blood pressure checked again. Monitor your blood pressure at home for 1 week or longer. If you are diagnosed with hypertension, you may have other blood or imaging tests to help your health care provider understand your overall risk for other conditions. How is this treated? This condition is treated by making healthy lifestyle changes, such as eating healthy foods, exercising more, and reducing your alcohol intake. You may be referred for counseling on a healthy diet and physical activity. Your health care provider may prescribe medicine if lifestyle changes are not enough to get your blood pressure under control and if: Your systolic blood pressure is above 130. Your diastolic blood pressure is above 80. Your personal target blood pressure may vary depending on your medical conditions, your age, and other factors. Follow these instructions at home: Eating and drinking  Eat a diet that is high in fiber and potassium, and low in sodium, added sugar, and fat. An example of this eating plan is called the DASH diet. DASH stands for Dietary Approaches to Stop Hypertension. To eat this way: Eat   plenty of fresh fruits and vegetables. Try to fill one half of your plate at each meal with fruits and vegetables. Eat whole grains, such as whole-wheat pasta, brown rice, or whole-grain bread. Fill about one  fourth of your plate with whole grains. Eat or drink low-fat dairy products, such as skim milk or low-fat yogurt. Avoid fatty cuts of meat, processed or cured meats, and poultry with skin. Fill about one fourth of your plate with lean proteins, such as fish, chicken without skin, beans, eggs, or tofu. Avoid pre-made and processed foods. These tend to be higher in sodium, added sugar, and fat. Reduce your daily sodium intake. Many people with hypertension should eat less than 1,500 mg of sodium a day. Do not drink alcohol if: Your health care provider tells you not to drink. You are pregnant, may be pregnant, or are planning to become pregnant. If you drink alcohol: Limit how much you have to: 0-1 drink a day for women. 0-2 drinks a day for men. Know how much alcohol is in your drink. In the [place] one drink equals one 12 oz bottle of beer (355 mL), one 5 oz glass of wine (148 mL), or one 1 oz glass of hard liquor (44 mL). Lifestyle  Work with your health care provider to maintain a healthy body weight or to lose weight. Ask what an ideal weight is for you. Get at least 30 minutes of exercise that causes your heart to beat faster (aerobic exercise) most days of the week. Activities may include walking, swimming, or biking. Include exercise to strengthen your muscles (resistance exercise), such as Pilates or lifting weights, as part of your weekly exercise routine. Try to do these types of exercises for 30 minutes at least 3 days a week. Do not use any products that contain nicotine or tobacco. These products include cigarettes, chewing tobacco, and vaping devices, such as e-cigarettes. If you need help quitting, ask your health care provider. Monitor your blood pressure at home as told by your health care provider. Keep all follow-up visits. This is important. Medicines Take over-the-counter and prescription medicines only as told by your health care provider. Follow directions carefully. Blood  pressure medicines must be taken as prescribed. Do not skip doses of blood pressure medicine. Doing this puts you at risk for problems and can make the medicine less effective. Ask your health care provider about side effects or reactions to medicines that you should watch for. Contact a health care provider if you: Think you are having a reaction to a medicine you are taking. Have headaches that keep coming back (recurring). Feel dizzy. Have swelling in your ankles. Have trouble with your vision. Get help right away if you: Develop a severe headache or confusion. Have unusual weakness or numbness. Feel faint. Have severe pain in your chest or abdomen. Vomit repeatedly. Have trouble breathing. These symptoms may be an emergency. Get help right away. Call 911. Do not wait to see if the symptoms will go away. Do not drive yourself to the hospital. Summary Hypertension is when the force of blood pumping through your arteries is too strong. If this condition is not controlled, it may put you at risk for serious complications. Your personal target blood pressure may vary depending on your medical conditions, your age, and other factors. For most people, a normal blood pressure is less than 120/80. Hypertension is treated with lifestyle changes, medicines, or a combination of both. Lifestyle changes include losing weight, eating a healthy,   low-sodium diet, exercising more, and limiting alcohol. This information is not intended to replace advice given to you by your health care provider. Make sure you discuss any questions you have with your health care provider. Document Revised: 05/08/2021 Document Reviewed: 05/08/2021 Elsevier Patient Education  2023 [employer]  

## 2022-02-28 NOTE — Progress Notes (Signed)
Andre Stein 56 y.o.   Chief Complaint  Patient presents with   Medication Refill    HISTORY OF PRESENT ILLNESS: This is a 56 y.o. male with history hypertension here for follow-up and medication refill. PCP is Dr. Sanda Linger.  Has scheduled appointment for later this month. Ran out of medication 1 week ago. Was taking indapamide 1.25 mg daily with good results. Asymptomatic. No other complaints or medical concerns today. BP Readings from Last 3 Encounters:  02/28/22 (!) 152/98  09/28/21 (!) 175/95  09/25/20 (!) 164/104     Medication Refill Pertinent negatives include no abdominal pain, chest pain, chills, congestion, coughing, fever, headaches, nausea, rash, sore throat or vomiting.     Prior to Admission medications   Medication Sig Start Date End Date Taking? Authorizing Provider  indapamide (LOZOL) 1.25 MG tablet TAKE 1 TABLET(1.25 MG) BY MOUTH DAILY 06/16/21   Etta Grandchild, MD    No Known Allergies  Patient Active Problem List   Diagnosis Date Noted   Hyperglycemia 09/25/2020   Chronic pain of both hips 09/25/2020   Loud snoring 09/25/2020   Allergic rhinitis due to allergen 09/26/2014   Eczema, dyshidrotic 12/27/2013   Pure hyperglyceridemia 10/30/2012   Routine general medical examination at a health care facility 08/12/2011   SLEEP APNEA 07/02/2010   Brachial neuritis or radiculitis 02/09/2010   Essential hypertension 12/06/2009   GERD 12/06/2009    Past Medical History:  Diagnosis Date   Abdominal pain    Bilateral inguinal hernia    Chest pain    GERD (gastroesophageal reflux disease)    Hypertension    Umbilical hernia    ventral    Past Surgical History:  Procedure Laterality Date   HERNIA REPAIR  10/29/11   BIH and lysis of adhesions   INGUINAL HERNIA REPAIR     left    UMBILICAL HERNIA REPAIR  10/29/11    Social History   Socioeconomic History   Marital status: Married    Spouse name: Not on file   Number of children: Not  on file   Years of education: Not on file   Highest education level: Not on file  Occupational History   Not on file  Tobacco Use   Smoking status: Never   Smokeless tobacco: Never  Substance and Sexual Activity   Alcohol use: No   Drug use: No   Sexual activity: Not on file  Other Topics Concern   Not on file  Social History Narrative   Not on file   Social Determinants of Health   Financial Resource Strain: Not on file  Food Insecurity: Not on file  Transportation Needs: Not on file  Physical Activity: Not on file  Stress: Not on file  Social Connections: Not on file  Intimate Partner Violence: Not on file    Family History  Problem Relation Age of Onset   Heart disease Father    Cancer Mother        pt believes it was colon   Arthritis Other    Diabetes Other    Hypertension Other    Stroke Other      Review of Systems  Constitutional: Negative.  Negative for chills and fever.  HENT: Negative.  Negative for congestion and sore throat.   Respiratory: Negative.  Negative for cough and shortness of breath.   Cardiovascular: Negative.  Negative for chest pain and palpitations.  Gastrointestinal: Negative.  Negative for abdominal pain, diarrhea, nausea and vomiting.  Genitourinary: Negative.  Negative for dysuria and hematuria.  Skin: Negative.  Negative for rash.  Neurological:  Negative for dizziness and headaches.  All other systems reviewed and are negative.  Today's Vitals   02/28/22 1110 02/28/22 1114  BP: (!) 152/98 (!) 160/100  Pulse: 74   Temp: 98.3 F (36.8 C)   TempSrc: Oral   SpO2: 98%   Weight: 241 lb 6 oz (109.5 kg)   Height: 5\' 11"  (1.803 m)    Body mass index is 33.66 kg/m.   Physical Exam Vitals reviewed.  Constitutional:      Appearance: Normal appearance.  HENT:     Head: Normocephalic.  Eyes:     Extraocular Movements: Extraocular movements intact.     Conjunctiva/sclera: Conjunctivae normal.     Pupils: Pupils are equal,  round, and reactive to light.  Cardiovascular:     Rate and Rhythm: Normal rate and regular rhythm.     Pulses: Normal pulses.     Heart sounds: Normal heart sounds.  Pulmonary:     Effort: Pulmonary effort is normal.     Breath sounds: Normal breath sounds.  Abdominal:     Palpations: Abdomen is soft.     Tenderness: There is no abdominal tenderness.  Musculoskeletal:     Cervical back: No tenderness.  Lymphadenopathy:     Cervical: No cervical adenopathy.  Skin:    General: Skin is warm and dry.     Capillary Refill: Capillary refill takes less than 2 seconds.  Neurological:     General: No focal deficit present.     Mental Status: He is alert and oriented to person, place, and time.  Psychiatric:        Mood and Affect: Mood normal.        Behavior: Behavior normal.      ASSESSMENT & PLAN: A total of 42 minutes was spent with the patient and counseling/coordination of care regarding preparing for this visit, review of most recent office visit notes, review of available medical records, diagnoses of hypertension and management, need to restart the medication, cardiovascular risks associated with uncontrolled hypertension, education and nutrition, prognosis, documentation, and need for follow-up.  Problem List Items Addressed This Visit       Cardiovascular and Mediastinum   Essential hypertension - Primary    Uncontrolled hypertension. Cardiovascular risks associated with uncontrolled hypertension discussed. Dietary approaches to stop hypertension discussed. Restart indapamide 1.25 mg daily. Patient has appointment to see Dr. in 2 weeks. Blood work done today.      Relevant Medications   indapamide (LOZOL) 1.25 MG tablet   Other Relevant Orders   CBC with Differential/Platelet   Comprehensive metabolic panel   Hemoglobin A1c   Lipid panel   Patient Instructions  Hypertension, Adult High blood pressure (hypertension) is when the force of blood pumping  through the arteries is too strong. The arteries are the blood vessels that carry blood from the heart throughout the body. Hypertension forces the heart to work harder to pump blood and may cause arteries to become narrow or stiff. Untreated or uncontrolled hypertension can lead to a heart attack, heart failure, a stroke, kidney disease, and other problems. A blood pressure reading consists of a higher number over a lower number. Ideally, your blood pressure should be below 120/80. The first ("top") number is called the systolic pressure. It is a measure of the pressure in your arteries as your heart beats. The second ("bottom") number is called the diastolic  pressure. It is a measure of the pressure in your arteries as the heart relaxes. What are the causes? The exact cause of this condition is not known. There are some conditions that result in high blood pressure. What increases the risk? Certain factors may make you more likely to develop high blood pressure. Some of these risk factors are under your control, including: Smoking. Not getting enough exercise or physical activity. Being overweight. Having too much fat, sugar, calories, or salt (sodium) in your diet. Drinking too much alcohol. Other risk factors include: Having a personal history of heart disease, diabetes, high cholesterol, or kidney disease. Stress. Having a family history of high blood pressure and high cholesterol. Having obstructive sleep apnea. Age. The risk increases with age. What are the signs or symptoms? High blood pressure may not cause symptoms. Very high blood pressure (hypertensive crisis) may cause: Headache. Fast or irregular heartbeats (palpitations). Shortness of breath. Nosebleed. Nausea and vomiting. Vision changes. Severe chest pain, dizziness, and seizures. How is this diagnosed? This condition is diagnosed by measuring your blood pressure while you are seated, with your arm resting on a flat  surface, your legs uncrossed, and your feet flat on the floor. The cuff of the blood pressure monitor will be placed directly against the skin of your upper arm at the level of your heart. Blood pressure should be measured at least twice using the same arm. Certain conditions can cause a difference in blood pressure between your right and left arms. If you have a high blood pressure reading during one visit or you have normal blood pressure with other risk factors, you may be asked to: Return on a different day to have your blood pressure checked again. Monitor your blood pressure at home for 1 week or longer. If you are diagnosed with hypertension, you may have other blood or imaging tests to help your health care provider understand your overall risk for other conditions. How is this treated? This condition is treated by making healthy lifestyle changes, such as eating healthy foods, exercising more, and reducing your alcohol intake. You may be referred for counseling on a healthy diet and physical activity. Your health care provider may prescribe medicine if lifestyle changes are not enough to get your blood pressure under control and if: Your systolic blood pressure is above 130. Your diastolic blood pressure is above 80. Your personal target blood pressure may vary depending on your medical conditions, your age, and other factors. Follow these instructions at home: Eating and drinking  Eat a diet that is high in fiber and potassium, and low in sodium, added sugar, and fat. An example of this eating plan is called the DASH diet. DASH stands for Dietary Approaches to Stop Hypertension. To eat this way: Eat plenty of fresh fruits and vegetables. Try to fill one half of your plate at each meal with fruits and vegetables. Eat whole grains, such as whole-wheat pasta, brown rice, or whole-grain bread. Fill about one fourth of your plate with whole grains. Eat or drink low-fat dairy products, such as  skim milk or low-fat yogurt. Avoid fatty cuts of meat, processed or cured meats, and poultry with skin. Fill about one fourth of your plate with lean proteins, such as fish, chicken without skin, beans, eggs, or tofu. Avoid pre-made and processed foods. These tend to be higher in sodium, added sugar, and fat. Reduce your daily sodium intake. Many people with hypertension should eat less than 1,500 mg of sodium a  day. Do not drink alcohol if: Your health care provider tells you not to drink. You are pregnant, may be pregnant, or are planning to become pregnant. If you drink alcohol: Limit how much you have to: 0-1 drink a day for women. 0-2 drinks a day for men. Know how much alcohol is in your drink. In the [place] one drink equals one 12 oz bottle of beer (355 mL), one 5 oz glass of wine (148 mL), or one 1 oz glass of hard liquor (44 mL). Lifestyle  Work with your health care provider to maintain a healthy body weight or to lose weight. Ask what an ideal weight is for you. Get at least 30 minutes of exercise that causes your heart to beat faster (aerobic exercise) most days of the week. Activities may include walking, swimming, or biking. Include exercise to strengthen your muscles (resistance exercise), such as Pilates or lifting weights, as part of your weekly exercise routine. Try to do these types of exercises for 30 minutes at least 3 days a week. Do not use any products that contain nicotine or tobacco. These products include cigarettes, chewing tobacco, and vaping devices, such as e-cigarettes. If you need help quitting, ask your health care provider. Monitor your blood pressure at home as told by your health care provider. Keep all follow-up visits. This is important. Medicines Take over-the-counter and prescription medicines only as told by your health care provider. Follow directions carefully. Blood pressure medicines must be taken as prescribed. Do not skip doses of blood pressure  medicine. Doing this puts you at risk for problems and can make the medicine less effective. Ask your health care provider about side effects or reactions to medicines that you should watch for. Contact a health care provider if you: Think you are having a reaction to a medicine you are taking. Have headaches that keep coming back (recurring). Feel dizzy. Have swelling in your ankles. Have trouble with your vision. Get help right away if you: Develop a severe headache or confusion. Have unusual weakness or numbness. Feel faint. Have severe pain in your chest or abdomen. Vomit repeatedly. Have trouble breathing. These symptoms may be an emergency. Get help right away. Call 911. Do not wait to see if the symptoms will go away. Do not drive yourself to the hospital. Summary Hypertension is when the force of blood pumping through your arteries is too strong. If this condition is not controlled, it may put you at risk for serious complications. Your personal target blood pressure may vary depending on your medical conditions, your age, and other factors. For most people, a normal blood pressure is less than 120/80. Hypertension is treated with lifestyle changes, medicines, or a combination of both. Lifestyle changes include losing weight, eating a healthy, low-sodium diet, exercising more, and limiting alcohol. This information is not intended to replace advice given to you by your health care provider. Make sure you discuss any questions you have with your health care provider. Document Revised: 05/08/2021 Document Reviewed: 05/08/2021 Elsevier Patient Education  [place], MD [facility]

## 2022-03-14 DIAGNOSIS — M5116 Intervertebral disc disorders with radiculopathy, lumbar region: Secondary | ICD-10-CM | POA: Diagnosis not present

## 2022-03-14 DIAGNOSIS — M5416 Radiculopathy, lumbar region: Secondary | ICD-10-CM | POA: Diagnosis not present

## 2022-04-23 ENCOUNTER — Ambulatory Visit: Payer: Federal, State, Local not specified - PPO | Admitting: Internal Medicine

## 2022-04-23 ENCOUNTER — Encounter: Payer: Self-pay | Admitting: Internal Medicine

## 2022-04-23 VITALS — BP 142/96 | HR 97 | Temp 98.4°F | Ht 71.0 in | Wt 237.0 lb

## 2022-04-23 DIAGNOSIS — Z125 Encounter for screening for malignant neoplasm of prostate: Secondary | ICD-10-CM

## 2022-04-23 DIAGNOSIS — I10 Essential (primary) hypertension: Secondary | ICD-10-CM

## 2022-04-23 DIAGNOSIS — Z0000 Encounter for general adult medical examination without abnormal findings: Secondary | ICD-10-CM

## 2022-04-23 DIAGNOSIS — Z23 Encounter for immunization: Secondary | ICD-10-CM

## 2022-04-23 MED ORDER — INDAPAMIDE 2.5 MG PO TABS: 2.5000 mg | ORAL_TABLET | Freq: Every day | ORAL | 0 refills | Status: AC

## 2022-04-23 NOTE — Patient Instructions (Signed)
Health Maintenance, Male Adopting a healthy lifestyle and getting preventive care are important in promoting health and wellness. Ask your health care provider about: The right schedule for you to have regular tests and exams. Things you can do on your own to prevent diseases and keep yourself healthy. What should I know about diet, weight, and exercise? Eat a healthy diet  Eat a diet that includes plenty of vegetables, fruits, low-fat dairy products, and lean protein. Do not eat a lot of foods that are high in solid fats, added sugars, or sodium. Maintain a healthy weight Body mass index (BMI) is a measurement that can be used to identify possible weight problems. It estimates body fat based on height and weight. Your health care provider can help determine your BMI and help you achieve or maintain a healthy weight. Get regular exercise Get regular exercise. This is one of the most important things you can do for your health. Most adults should: Exercise for at least 150 minutes each week. The exercise should increase your heart rate and make you sweat (moderate-intensity exercise). Do strengthening exercises at least twice a week. This is in addition to the moderate-intensity exercise. Spend less time sitting. Even light physical activity can be beneficial. Watch cholesterol and blood lipids Have your blood tested for lipids and cholesterol at 58 years of age, then have this test every 5 years. You may need to have your cholesterol levels checked more often if: Your lipid or cholesterol levels are high. You are older than 58 years of age. You are at high risk for heart disease. What should I know about cancer screening? Many types of cancers can be detected early and may often be prevented. Depending on your health history and family history, you may need to have cancer screening at various ages. This may include screening for: Colorectal cancer. Prostate cancer. Skin cancer. Lung  cancer. What should I know about heart disease, diabetes, and high blood pressure? Blood pressure and heart disease High blood pressure causes heart disease and increases the risk of stroke. This is more likely to develop in people who have high blood pressure readings or are overweight. Talk with your health care provider about your target blood pressure readings. Have your blood pressure checked: Every 3-5 years if you are 18-39 years of age. Every year if you are 40 years old or older. If you are between the ages of 65 and 75 and are a current or former smoker, ask your health care provider if you should have a one-time screening for abdominal aortic aneurysm (AAA). Diabetes Have regular diabetes screenings. This checks your fasting blood sugar level. Have the screening done: Once every three years after age 45 if you are at a normal weight and have a low risk for diabetes. More often and at a younger age if you are overweight or have a high risk for diabetes. What should I know about preventing infection? Hepatitis B If you have a higher risk for hepatitis B, you should be screened for this virus. Talk with your health care provider to find out if you are at risk for hepatitis B infection. Hepatitis C Blood testing is recommended for: Everyone born from 1945 through 1965. Anyone with known risk factors for hepatitis C. Sexually transmitted infections (STIs) You should be screened each year for STIs, including gonorrhea and chlamydia, if: You are sexually active and are younger than 58 years of age. You are older than 58 years of age and your   health care provider tells you that you are at risk for this type of infection. Your sexual activity has changed since you were last screened, and you are at increased risk for chlamydia or gonorrhea. Ask your health care provider if you are at risk. Ask your health care provider about whether you are at high risk for HIV. Your health care provider  may recommend a prescription medicine to help prevent HIV infection. If you choose to take medicine to prevent HIV, you should first get tested for HIV. You should then be tested every 3 months for as long as you are taking the medicine. Follow these instructions at home: Alcohol use Do not drink alcohol if your health care provider tells you not to drink. If you drink alcohol: Limit how much you have to 0-2 drinks a day. Know how much alcohol is in your drink. In the [place] one drink equals one 12 oz bottle of beer (355 mL), one 5 oz glass of wine (148 mL), or one 1 oz glass of hard liquor (44 mL). Lifestyle Do not use any products that contain nicotine or tobacco. These products include cigarettes, chewing tobacco, and vaping devices, such as e-cigarettes. If you need help quitting, ask your health care provider. Do not use street drugs. Do not share needles. Ask your health care provider for help if you need support or information about quitting drugs. General instructions Schedule regular health, dental, and eye exams. Stay current with your vaccines. Tell your health care provider if: You often feel depressed. You have ever been abused or do not feel safe at home. Summary Adopting a healthy lifestyle and getting preventive care are important in promoting health and wellness. Follow your health care provider's instructions about healthy diet, exercising, and getting tested or screened for diseases. Follow your health care provider's instructions on monitoring your cholesterol and blood pressure. This information is not intended to replace advice given to you by your health care provider. Make sure you discuss any questions you have with your health care provider. Document Revised: 11/20/2020 Document Reviewed: 11/20/2020 Elsevier Patient Education  2023 [employer]  

## 2022-04-23 NOTE — Progress Notes (Signed)
Subjective:  Patient ID: Andre Stein, male    DOB: [birth year]  Age: 56 y.o. MRN: [id number]  CC: Hypertension, Annual Exam, and Hyperlipidemia   HPI Andre Stein presents for a CPX and f/up -  He complains of chronic fatigue but he is active and denies chest pain, shortness of breath, diaphoresis, or edema.  Outpatient Medications Prior to Visit  Medication Sig Dispense Refill   rosuvastatin (CRESTOR) 10 MG tablet Take 1 tablet (10 mg total) by mouth daily. 90 tablet 3   indapamide (LOZOL) 1.25 MG tablet Take 1 tablet (1.25 mg total) by mouth daily. 90 tablet 0   No facility-administered medications prior to visit.    ROS Review of Systems  Constitutional:  Positive for fatigue. Negative for appetite change, diaphoresis and unexpected weight change.  HENT: Negative.    Eyes: Negative.   Respiratory:  Positive for apnea. Negative for cough, shortness of breath and wheezing.   Cardiovascular:  Negative for palpitations and leg swelling.  Gastrointestinal: Negative.  Negative for abdominal pain, constipation, diarrhea, nausea and vomiting.  Endocrine: Negative.   Genitourinary: Negative.  Negative for difficulty urinating.  Musculoskeletal: Negative.  Negative for arthralgias and myalgias.  Skin: Negative.   Allergic/Immunologic: Negative.   Neurological: Negative.  Negative for dizziness, weakness, light-headedness and numbness.  Hematological:  Negative for adenopathy. Does not bruise/bleed easily.  Psychiatric/Behavioral: Negative.      Objective:  BP (!) 142/96 (BP Location: Right Arm, Patient Position: Sitting, Cuff Size: Large)   Pulse 97   Temp 98.4 F (36.9 C) (Oral)   Ht 5\' 11"  (1.803 m)   Wt 237 lb (107.5 kg)   SpO2 97%   BMI 33.05 kg/m   BP Readings from Last 3 Encounters:  04/23/22 (!) 142/96  02/28/22 (!) 160/100  09/28/21 (!) 175/95    Wt Readings from Last 3 Encounters:  04/23/22 237 lb (107.5 kg)  02/28/22 241 lb 6 oz (109.5 kg)   09/28/21 230 lb (104.3 kg)    Physical Exam Vitals reviewed.  HENT:     Nose: Nose normal.     Mouth/Throat:     Mouth: Mucous membranes are moist.  Eyes:     General: No scleral icterus.    Conjunctiva/sclera: Conjunctivae normal.  Cardiovascular:     Rate and Rhythm: Normal rate and regular rhythm.     Heart sounds: No murmur heard.    Comments: EKG- NSR, 73 bpm ?LAE No ST/T wave changes, LVH, or Q waves Pulmonary:     Effort: Pulmonary effort is normal.     Breath sounds: No stridor. No wheezing, rhonchi or rales.  Abdominal:     General: Abdomen is protuberant. There is no distension.     Palpations: There is no hepatomegaly, splenomegaly or mass.     Tenderness: There is no abdominal tenderness. There is no guarding.     Hernia: No hernia is present. There is no hernia in the left inguinal area or right inguinal area.  Genitourinary:    Pubic Area: No rash.      Penis: Normal and circumcised.      Testes: Normal.     Epididymis:     Right: Normal.     Left: Normal.     Prostate: Normal. Not enlarged, not tender and no nodules present.     Rectum: Normal. Guaiac result negative. No mass, tenderness, anal fissure, external hemorrhoid or internal hemorrhoid. Normal anal tone.  Musculoskeletal:  General: Normal range of motion.     Cervical back: Neck supple.     Right lower leg: No edema.     Left lower leg: No edema.  Lymphadenopathy:     Cervical: No cervical adenopathy.     Lower Body: No right inguinal adenopathy. No left inguinal adenopathy.  Skin:    General: Skin is warm and dry.  Neurological:     General: No focal deficit present.     Mental Status: He is alert.  Psychiatric:        Mood and Affect: Mood normal.        Behavior: Behavior normal.     Lab Results  Component Value Date   WBC 5.7 02/28/2022   HGB 13.3 02/28/2022   HCT 40.2 02/28/2022   PLT 193.0 02/28/2022   GLUCOSE 98 02/28/2022   CHOL 217 (H) 02/28/2022   TRIG 292.0 (H)  02/28/2022   HDL 34.70 (L) 02/28/2022   LDLDIRECT 53.0 02/28/2022   LDLCALC 95 10/30/2012   ALT 25 02/28/2022   AST 21 02/28/2022   NA 141 02/28/2022   K 4.1 02/28/2022   CL 104 02/28/2022   CREATININE 1.20 02/28/2022   BUN 19 02/28/2022   CO2 29 02/28/2022   TSH 2.25 04/23/2022   PSA 2.34 04/23/2022   HGBA1C 6.0 02/28/2022    No results found.  Assessment & Plan:   [name] was seen today for hypertension, annual exam and hyperlipidemia.  Diagnoses and all orders for this visit:  Routine general medical examination at a health care facility- Exam completed, labs reviewed, vaccines reviewed and updated, cancer screenings are up-to-date, patient education was given. -     PSA; Future -     PSA  Essential hypertension- He has not achieved his blood pressure goal.  He will continue working on his lifestyle modifications.  Will double the dose of the thiazide diuretic. -     TSH; Future -     indapamide (LOZOL) 2.5 MG tablet; Take 1 tablet (2.5 mg total) by mouth daily. -     EKG 12-Lead -     TSH  Other orders -     Flu Vaccine QUAD 6+ mos PF IM (Fluarix Quad PF) -     Zoster Recombinant (Shingrix )   I have discontinued Adele Barthel "Ken"'s indapamide. I am also having him start on indapamide. Additionally, I am having him maintain his rosuvastatin.  Meds ordered this encounter  Medications   indapamide (LOZOL) 2.5 MG tablet    Sig: Take 1 tablet (2.5 mg total) by mouth daily.    Dispense:  90 tablet    Refill:  0     Follow-up: Return in about 3 months (around 07/24/2022).  Scarlette Calico, MD

## 2022-04-24 LAB — PSA: PSA: 2.34 ng/mL (ref 0.10–4.00)

## 2022-04-24 LAB — TSH: TSH: 2.25 u[IU]/mL (ref 0.35–5.50)

## 2022-04-30 DIAGNOSIS — H35033 Hypertensive retinopathy, bilateral: Secondary | ICD-10-CM | POA: Diagnosis not present

## 2022-07-24 ENCOUNTER — Encounter: Payer: Self-pay | Admitting: Internal Medicine

## 2022-07-24 ENCOUNTER — Ambulatory Visit: Payer: Federal, State, Local not specified - PPO | Admitting: Internal Medicine

## 2022-07-24 VITALS — BP 138/88 | HR 84 | Temp 98.2°F | Ht 71.0 in | Wt 240.0 lb

## 2022-07-24 DIAGNOSIS — E785 Hyperlipidemia, unspecified: Secondary | ICD-10-CM

## 2022-07-24 DIAGNOSIS — K219 Gastro-esophageal reflux disease without esophagitis: Secondary | ICD-10-CM

## 2022-07-24 DIAGNOSIS — R739 Hyperglycemia, unspecified: Secondary | ICD-10-CM | POA: Diagnosis not present

## 2022-07-24 DIAGNOSIS — Z23 Encounter for immunization: Secondary | ICD-10-CM | POA: Diagnosis not present

## 2022-07-24 DIAGNOSIS — I10 Essential (primary) hypertension: Secondary | ICD-10-CM | POA: Diagnosis not present

## 2022-07-24 DIAGNOSIS — E781 Pure hyperglyceridemia: Secondary | ICD-10-CM

## 2022-07-24 LAB — BASIC METABOLIC PANEL
BUN: 16 mg/dL (ref 6–23)
CO2: 27 mEq/L (ref 19–32)
Calcium: 9.5 mg/dL (ref 8.4–10.5)
Chloride: 103 mEq/L (ref 96–112)
Creatinine, Ser: 1.21 mg/dL (ref 0.40–1.50)
GFR: 66.22 mL/min (ref >60.00)
Glucose, Bld: 113 mg/dL — ABNORMAL HIGH (ref 70–99)
Potassium: 4 mEq/L (ref 3.5–5.1)
Sodium: 140 mEq/L (ref 135–145)

## 2022-07-24 LAB — CBC WITH DIFFERENTIAL/PLATELET
Basophils Absolute: 0 10*3/uL (ref 0.0–0.1)
Basophils Relative: 0.6 % (ref 0.0–3.0)
Eosinophils Absolute: 0.2 10*3/uL (ref 0.0–0.7)
Eosinophils Relative: 3.4 % (ref 0.0–5.0)
HCT: 43.3 % (ref 39.0–52.0)
Hemoglobin: 14.1 g/dL (ref 13.0–17.0)
Lymphocytes Relative: 19.6 % (ref 12.0–46.0)
Lymphs Abs: 1.2 10*3/uL (ref 0.7–4.0)
MCHC: 32.7 g/dL (ref 30.0–36.0)
MCV: 90.5 fl (ref 78.0–100.0)
Monocytes Absolute: 0.6 10*3/uL (ref 0.1–1.0)
Monocytes Relative: 10.3 % (ref 3.0–12.0)
Neutro Abs: 3.9 10*3/uL (ref 1.4–7.7)
Neutrophils Relative %: 66.1 % (ref 43.0–77.0)
Platelets: 203 10*3/uL (ref 150.0–400.0)
RBC: 4.79 Mil/uL (ref 4.22–5.81)
RDW: 14 % (ref 11.5–15.5)
WBC: 5.9 10*3/uL (ref 4.0–10.5)

## 2022-07-24 LAB — TRIGLYCERIDES: Triglycerides: 241 mg/dL — ABNORMAL HIGH (ref 0.0–149.0)

## 2022-07-24 LAB — HEMOGLOBIN A1C: Hgb A1c MFr Bld: 5.8 % (ref 4.6–6.5)

## 2022-07-24 MED ORDER — ROSUVASTATIN CALCIUM 10 MG PO TABS: 10.0000 mg | ORAL_TABLET | Freq: Every day | ORAL | 1 refills | Status: AC

## 2022-07-24 NOTE — Progress Notes (Signed)
Subjective:  Patient ID: Andre Stein, male    DOB: 1966/02/15  Age: 57 y.o. MRN: [id number]  CC: Hypertension and Hyperlipidemia   HPI KASSIM GUERTIN presents for f/up -  He is active and denies DOE, SOB, CP, edema.  Outpatient Medications Prior to Visit  Medication Sig Dispense Refill   indapamide (LOZOL) 2.5 MG tablet Take 1 tablet (2.5 mg total) by mouth daily. 90 tablet 0   rosuvastatin (CRESTOR) 10 MG tablet Take 1 tablet (10 mg total) by mouth daily. 90 tablet 3   No facility-administered medications prior to visit.    ROS Review of Systems  Constitutional:  Positive for unexpected weight change (wt gain). Negative for chills, diaphoresis and fatigue.  HENT: Negative.    Eyes: Negative.   Respiratory:  Negative for cough, shortness of breath and wheezing.   Cardiovascular:  Negative for chest pain, palpitations and leg swelling.  Gastrointestinal:  Negative for abdominal pain, diarrhea, nausea and vomiting.  Genitourinary: Negative.  Negative for difficulty urinating and dysuria.  Musculoskeletal:  Negative for arthralgias and myalgias.  Skin: Negative.   Neurological:  Negative for dizziness, weakness and light-headedness.  Hematological:  Negative for adenopathy. Does not bruise/bleed easily.  Psychiatric/Behavioral: Negative.      Objective:  BP 138/88 (BP Location: Left Arm, Patient Position: Sitting, Cuff Size: Large)   Pulse 84   Temp 98.2 F (36.8 C) (Oral)   Ht 5\' 11"  (1.803 m)   Wt 240 lb (108.9 kg)   SpO2 93%   BMI 33.47 kg/m   BP Readings from Last 3 Encounters:  07/24/22 138/88  04/23/22 (!) 142/96  02/28/22 (!) 160/100    Wt Readings from Last 3 Encounters:  07/24/22 240 lb (108.9 kg)  04/23/22 237 lb (107.5 kg)  02/28/22 241 lb 6 oz (109.5 kg)    Physical Exam Vitals reviewed.  HENT:     Nose: Nose normal.     Mouth/Throat:     Mouth: Mucous membranes are moist.  Eyes:     General: No scleral icterus.     Conjunctiva/sclera: Conjunctivae normal.  Cardiovascular:     Rate and Rhythm: Normal rate and regular rhythm.     Heart sounds: No murmur heard. Pulmonary:     Effort: Pulmonary effort is normal.     Breath sounds: No stridor. No wheezing, rhonchi or rales.  Abdominal:     General: Abdomen is flat.     Palpations: There is no mass.     Tenderness: There is no abdominal tenderness. There is no guarding.     Hernia: No hernia is present.  Musculoskeletal:        General: Normal range of motion.     Cervical back: Neck supple.     Right lower leg: No edema.     Left lower leg: No edema.  Lymphadenopathy:     Cervical: No cervical adenopathy.  Skin:    General: Skin is warm and dry.  Neurological:     General: No focal deficit present.     Mental Status: He is alert.  Psychiatric:        Mood and Affect: Mood normal.        Behavior: Behavior normal.     Lab Results  Component Value Date   WBC 5.9 07/24/2022   HGB 14.1 07/24/2022   HCT 43.3 07/24/2022   PLT 203.0 07/24/2022   GLUCOSE 113 (H) 07/24/2022   CHOL 217 (H) 02/28/2022   TRIG 241.0 (  H) 07/24/2022   HDL 34.70 (L) 02/28/2022   LDLDIRECT 53.0 02/28/2022   LDLCALC 95 10/30/2012   ALT 25 02/28/2022   AST 21 02/28/2022   NA 140 07/24/2022   K 4.0 07/24/2022   CL 103 07/24/2022   CREATININE 1.21 07/24/2022   BUN 16 07/24/2022   CO2 27 07/24/2022   TSH 2.25 04/23/2022   PSA 2.34 04/23/2022   HGBA1C 5.8 07/24/2022    No results found.  Assessment & Plan:   Michaeljohn was seen today for hypertension and hyperlipidemia.  Diagnoses and all orders for this visit:  Dyslipidemia, goal LDL below 100- LDL goal achieved. Doing well on the statin  -     rosuvastatin (CRESTOR) 10 MG tablet; Take 1 tablet (10 mg total) by mouth daily.  Pure hyperglyceridemia -     Triglycerides; Future -     Triglycerides  Hyperglycemia -     Basic metabolic panel; Future -     Hemoglobin A1c; Future -     Hemoglobin A1c -      Basic metabolic panel  Essential hypertension - He has not achieved his BP. I encouraged him to improve his lifestyle modifications. -     Basic metabolic panel; Future -     CBC with Differential/Platelet; Future -     CBC with Differential/Platelet -     Basic metabolic panel  Gastroesophageal reflux disease without esophagitis- Sx's are well controlled. -     CBC with Differential/Platelet; Future -     CBC with Differential/Platelet  Other orders -     Zoster Recombinant (Shingrix )   I am having Adele Barthel "Ken" maintain his indapamide and rosuvastatin.  Meds ordered this encounter  Medications   rosuvastatin (CRESTOR) 10 MG tablet    Sig: Take 1 tablet (10 mg total) by mouth daily.    Dispense:  90 tablet    Refill:  1     Follow-up: Return in about 6 months (around 01/22/2023).  Scarlette Calico, MD

## 2022-07-24 NOTE — Patient Instructions (Signed)
Hypertension, Adult High blood pressure (hypertension) is when the force of blood pumping through the arteries is too strong. The arteries are the blood vessels that carry blood from the heart throughout the body. Hypertension forces the heart to work harder to pump blood and may cause arteries to become narrow or stiff. Untreated or uncontrolled hypertension can lead to a heart attack, heart failure, a stroke, kidney disease, and other problems. A blood pressure reading consists of a higher number over a lower number. Ideally, your blood pressure should be below 120/80. The first ("top") number is called the systolic pressure. It is a measure of the pressure in your arteries as your heart beats. The second ("bottom") number is called the diastolic pressure. It is a measure of the pressure in your arteries as the heart relaxes. What are the causes? The exact cause of this condition is not known. There are some conditions that result in high blood pressure. What increases the risk? Certain factors may make you more likely to develop high blood pressure. Some of these risk factors are under your control, including: Smoking. Not getting enough exercise or physical activity. Being overweight. Having too much fat, sugar, calories, or salt (sodium) in your diet. Drinking too much alcohol. Other risk factors include: Having a personal history of heart disease, diabetes, high cholesterol, or kidney disease. Stress. Having a family history of high blood pressure and high cholesterol. Having obstructive sleep apnea. Age. The risk increases with age. What are the signs or symptoms? High blood pressure may not cause symptoms. Very high blood pressure (hypertensive crisis) may cause: Headache. Fast or irregular heartbeats (palpitations). Shortness of breath. Nosebleed. Nausea and vomiting. Vision changes. Severe chest pain, dizziness, and seizures. How is this diagnosed? This condition is diagnosed by  measuring your blood pressure while you are seated, with your arm resting on a flat surface, your legs uncrossed, and your feet flat on the floor. The cuff of the blood pressure monitor will be placed directly against the skin of your upper arm at the level of your heart. Blood pressure should be measured at least twice using the same arm. Certain conditions can cause a difference in blood pressure between your right and left arms. If you have a high blood pressure reading during one visit or you have normal blood pressure with other risk factors, you may be asked to: Return on a different day to have your blood pressure checked again. Monitor your blood pressure at home for 1 week or longer. If you are diagnosed with hypertension, you may have other blood or imaging tests to help your health care provider understand your overall risk for other conditions. How is this treated? This condition is treated by making healthy lifestyle changes, such as eating healthy foods, exercising more, and reducing your alcohol intake. You may be referred for counseling on a healthy diet and physical activity. Your health care provider may prescribe medicine if lifestyle changes are not enough to get your blood pressure under control and if: Your systolic blood pressure is above 130. Your diastolic blood pressure is above 80. Your personal target blood pressure may vary depending on your medical conditions, your age, and other factors. Follow these instructions at home: Eating and drinking  Eat a diet that is high in fiber and potassium, and low in sodium, added sugar, and fat. An example of this eating plan is called the DASH diet. DASH stands for Dietary Approaches to Stop Hypertension. To eat this way: Eat   plenty of fresh fruits and vegetables. Try to fill one half of your plate at each meal with fruits and vegetables. Eat whole grains, such as whole-wheat pasta, brown rice, or whole-grain bread. Fill about one  fourth of your plate with whole grains. Eat or drink low-fat dairy products, such as skim milk or low-fat yogurt. Avoid fatty cuts of meat, processed or cured meats, and poultry with skin. Fill about one fourth of your plate with lean proteins, such as fish, chicken without skin, beans, eggs, or tofu. Avoid pre-made and processed foods. These tend to be higher in sodium, added sugar, and fat. Reduce your daily sodium intake. Many people with hypertension should eat less than 1,500 mg of sodium a day. Do not drink alcohol if: Your health care provider tells you not to drink. You are pregnant, may be pregnant, or are planning to become pregnant. If you drink alcohol: Limit how much you have to: 0-1 drink a day for women. 0-2 drinks a day for men. Know how much alcohol is in your drink. In the [place] one drink equals one 12 oz bottle of beer (355 mL), one 5 oz glass of wine (148 mL), or one 1 oz glass of hard liquor (44 mL). Lifestyle  Work with your health care provider to maintain a healthy body weight or to lose weight. Ask what an ideal weight is for you. Get at least 30 minutes of exercise that causes your heart to beat faster (aerobic exercise) most days of the week. Activities may include walking, swimming, or biking. Include exercise to strengthen your muscles (resistance exercise), such as Pilates or lifting weights, as part of your weekly exercise routine. Try to do these types of exercises for 30 minutes at least 3 days a week. Do not use any products that contain nicotine or tobacco. These products include cigarettes, chewing tobacco, and vaping devices, such as e-cigarettes. If you need help quitting, ask your health care provider. Monitor your blood pressure at home as told by your health care provider. Keep all follow-up visits. This is important. Medicines Take over-the-counter and prescription medicines only as told by your health care provider. Follow directions carefully. Blood  pressure medicines must be taken as prescribed. Do not skip doses of blood pressure medicine. Doing this puts you at risk for problems and can make the medicine less effective. Ask your health care provider about side effects or reactions to medicines that you should watch for. Contact a health care provider if you: Think you are having a reaction to a medicine you are taking. Have headaches that keep coming back (recurring). Feel dizzy. Have swelling in your ankles. Have trouble with your vision. Get help right away if you: Develop a severe headache or confusion. Have unusual weakness or numbness. Feel faint. Have severe pain in your chest or abdomen. Vomit repeatedly. Have trouble breathing. These symptoms may be an emergency. Get help right away. Call 911. Do not wait to see if the symptoms will go away. Do not drive yourself to the hospital. Summary Hypertension is when the force of blood pumping through your arteries is too strong. If this condition is not controlled, it may put you at risk for serious complications. Your personal target blood pressure may vary depending on your medical conditions, your age, and other factors. For most people, a normal blood pressure is less than 120/80. Hypertension is treated with lifestyle changes, medicines, or a combination of both. Lifestyle changes include losing weight, eating a healthy,   low-sodium diet, exercising more, and limiting alcohol. This information is not intended to replace advice given to you by your health care provider. Make sure you discuss any questions you have with your health care provider. Document Revised: 05/08/2021 Document Reviewed: 05/08/2021 Elsevier Patient Education  2023 [employer]  

## 2023-02-15 ENCOUNTER — Other Ambulatory Visit: Payer: Self-pay | Admitting: Internal Medicine

## 2023-02-15 DIAGNOSIS — I10 Essential (primary) hypertension: Secondary | ICD-10-CM
